# Patient Record
Sex: Female | Born: 1980 | Race: Black or African American | Hispanic: No | Marital: Single | Smoking: Never smoker
Health system: Southern US, Community
[De-identification: ages and names within clinical notes are randomized; demographics above are authoritative.]

## PROBLEM LIST (undated history)

## (undated) ENCOUNTER — Emergency Department (HOSPITAL_COMMUNITY): Admission: EM | Discharge status: Absent without leave | Payer: Medicaid Other

## (undated) DIAGNOSIS — D649 Anemia, unspecified: Secondary | ICD-10-CM

## (undated) DIAGNOSIS — I10 Essential (primary) hypertension: Secondary | ICD-10-CM

## (undated) DIAGNOSIS — R87629 Unspecified abnormal cytological findings in specimens from vagina: Secondary | ICD-10-CM

## (undated) DIAGNOSIS — B009 Herpesviral infection, unspecified: Secondary | ICD-10-CM

## (undated) DIAGNOSIS — D219 Benign neoplasm of connective and other soft tissue, unspecified: Secondary | ICD-10-CM

## (undated) HISTORY — DX: Unspecified abnormal cytological findings in specimens from vagina: R87.629

## (undated) HISTORY — DX: Anemia, unspecified: D64.9

## (undated) HISTORY — DX: Essential (primary) hypertension: I10

## (undated) HISTORY — PX: MYOMECTOMY: SHX85

---

## 2002-07-27 ENCOUNTER — Emergency Department (HOSPITAL_COMMUNITY): Admission: EM | Admit: 2002-07-27 | Discharge: 2002-07-27 | Payer: Self-pay

## 2002-07-28 ENCOUNTER — Emergency Department (HOSPITAL_COMMUNITY): Admission: EM | Admit: 2002-07-28 | Discharge: 2002-07-28 | Payer: Self-pay | Admitting: Emergency Medicine

## 2003-09-11 ENCOUNTER — Emergency Department (HOSPITAL_COMMUNITY): Admission: EM | Admit: 2003-09-11 | Discharge: 2003-09-12 | Payer: Self-pay | Admitting: Emergency Medicine

## 2005-07-20 ENCOUNTER — Emergency Department (HOSPITAL_COMMUNITY): Admission: EM | Admit: 2005-07-20 | Discharge: 2005-07-21 | Payer: Self-pay | Admitting: Emergency Medicine

## 2013-05-15 HISTORY — PX: UTERINE FIBROID SURGERY: SHX826

## 2013-07-14 ENCOUNTER — Encounter: Payer: Self-pay | Admitting: Obstetrics & Gynecology

## 2014-10-04 ENCOUNTER — Ambulatory Visit (INDEPENDENT_AMBULATORY_CARE_PROVIDER_SITE_OTHER): Payer: 59 | Admitting: Family Medicine

## 2014-10-04 VITALS — BP 118/90 | HR 138 | Temp 102.7°F | Resp 22 | Ht 66.0 in | Wt 259.0 lb

## 2014-10-04 DIAGNOSIS — R059 Cough, unspecified: Secondary | ICD-10-CM

## 2014-10-04 DIAGNOSIS — R000 Tachycardia, unspecified: Secondary | ICD-10-CM | POA: Diagnosis not present

## 2014-10-04 DIAGNOSIS — R05 Cough: Secondary | ICD-10-CM | POA: Diagnosis not present

## 2014-10-04 DIAGNOSIS — H6691 Otitis media, unspecified, right ear: Secondary | ICD-10-CM

## 2014-10-04 DIAGNOSIS — J069 Acute upper respiratory infection, unspecified: Secondary | ICD-10-CM | POA: Diagnosis not present

## 2014-10-04 DIAGNOSIS — J029 Acute pharyngitis, unspecified: Secondary | ICD-10-CM | POA: Diagnosis not present

## 2014-10-04 DIAGNOSIS — R509 Fever, unspecified: Secondary | ICD-10-CM | POA: Diagnosis not present

## 2014-10-04 LAB — POCT RAPID STREP A (OFFICE): RAPID STREP A SCREEN: NEGATIVE

## 2014-10-04 LAB — POCT CBC
Granulocyte percent: 77.7 %G (ref 37–80)
HEMATOCRIT: 39.4 % (ref 37.7–47.9)
Hemoglobin: 12.1 g/dL — AB (ref 12.2–16.2)
LYMPH, POC: 1.6 (ref 0.6–3.4)
MCH: 22.9 pg — AB (ref 27–31.2)
MCHC: 30.6 g/dL — AB (ref 31.8–35.4)
MCV: 74.8 fL — AB (ref 80–97)
MID (CBC): 0.5 (ref 0–0.9)
MPV: 7 fL (ref 0–99.8)
POC GRANULOCYTE: 7.3 — AB (ref 2–6.9)
POC LYMPH PERCENT: 17.2 %L (ref 10–50)
POC MID %: 5.1 %M (ref 0–12)
Platelet Count, POC: 340 10*3/uL (ref 142–424)
RBC: 5.27 M/uL (ref 4.04–5.48)
RDW, POC: 16.8 %
WBC: 9.4 10*3/uL (ref 4.6–10.2)

## 2014-10-04 LAB — GLUCOSE, POCT (MANUAL RESULT ENTRY): POC GLUCOSE: 105 mg/dL — AB (ref 70–99)

## 2014-10-04 LAB — POCT INFLUENZA A/B
INFLUENZA A, POC: NEGATIVE
Influenza B, POC: NEGATIVE

## 2014-10-04 MED ORDER — IPRATROPIUM BROMIDE 0.03 % NA SOLN: 2.0000 | Freq: Two times a day (BID) | NASAL | Status: DC

## 2014-10-04 MED ORDER — DM-GUAIFENESIN ER 30-600 MG PO TB12: 1.0000 | ORAL_TABLET | Freq: Two times a day (BID) | ORAL | Status: DC

## 2014-10-04 MED ORDER — AMOXICILLIN-POT CLAVULANATE 875-125 MG PO TABS: 1.0000 | ORAL_TABLET | Freq: Two times a day (BID) | ORAL | Status: DC

## 2014-10-04 NOTE — Progress Notes (Addendum)
Subjective:  This chart was scribed for Reginia Forts, MD by Leandra Kern, Medical Scribe. This patient was seen in Room 1 and the patient's care was started at 12:22 PM.   Patient ID: Catherine [name], female    DOB: 09/18/1980, 34 y.o.   MRN: [id number]  10/04/2014  Sore Throat; Nasal Congestion; Cough; and Fever   HPI  HPI Comments: Catherine [name] is a 34 y.o. female who presents to [facility] complaining of sore throat, gradual onset seven days ago.  Patient reports having associated symptoms of Chills, subjective fever, diaphoresis, bilateral ear pain, rhinorrhea (green discharge), mild coughs, nausea, and vomiting. Patient notes that she has 2 episodes of vomiting since 5 days ago. She also notes that she has trouble swallowing and talking due to sore throat. Patient reports that one night she had increased urinary frequency, once every two hours, whereas baseline is two times maximum during the night. She states that she has been taking an OTC medication for cold. She has not taken any medication for fever today however. Patient denies abdominal pain, diarrhea, skin rash, or dysuria.  Patient is not UTD on flu shot, and she reports that she has been in a sick environment (sore throat). Patient is a 3rd grade teacher, and she has not missed any school days since symptoms.    PCP in [place].  Review of Systems  Constitutional: Positive for fever, chills and diaphoresis.  HENT: Positive for congestion, ear pain, rhinorrhea, sinus pressure, sore throat, trouble swallowing and voice change. Negative for dental problem, drooling, ear discharge, nosebleeds and postnasal drip.   Respiratory: Positive for cough. Negative for shortness of breath, wheezing and stridor.   Gastrointestinal: Positive for nausea and vomiting. Negative for abdominal pain and diarrhea.  Genitourinary: Negative for dysuria and urgency.  Skin: Negative for rash.  Neurological: Positive for headaches.  Negative for dizziness and light-headedness.    Past Medical History  Diagnosis Date  . Anemia   . Hypertension    History reviewed. No pertinent past surgical history. No Known Allergies Current Outpatient Prescriptions  Medication Sig Dispense Refill  . valACYclovir (VALTREX) 500 MG tablet Take 500 mg by mouth 2 (two) times daily.    Marland Kitchen amoxicillin-clavulanate (AUGMENTIN) 875-125 MG per tablet Take 1 tablet by mouth 2 (two) times daily. 20 tablet 0  . dextromethorphan-guaiFENesin (MUCINEX DM) 30-600 MG per 12 hr tablet Take 1 tablet by mouth 2 (two) times daily. 28 tablet 0  . ipratropium (ATROVENT) 0.03 % nasal spray Place 2 sprays into the nose 2 (two) times daily. 30 mL 0  . lisinopril-hydrochlorothiazide (PRINZIDE,ZESTORETIC) 20-12.5 MG per tablet   0  . triamcinolone cream (KENALOG) 0.1 %   0   No current facility-administered medications for this visit.   History   Social History  . Marital Status: Single    Spouse Name: N/A  . Number of Children: N/A  . Years of Education: N/A   Occupational History  . Not on file.   Social History Main Topics  . Smoking status: Never Smoker   . Smokeless tobacco: Not on file  . Alcohol Use: Not on file  . Drug Use: Not on file  . Sexual Activity: Not on file   Other Topics Concern  . Not on file   Social History Narrative  . No narrative on file        Objective:    BP 118/90 mmHg  Pulse 138  Temp(Src) 102.7  F (39.3 C)  Resp 22  Ht 5\' 6"  (1.676 m)  Wt 259 lb (117.482 kg)  BMI 41.82 kg/m2  SpO2 97%  LMP 10/03/2014 Physical Exam  Constitutional: She is oriented to person, place, and time. She appears well-developed and well-nourished. No distress.  HENT:  [name]: Normocephalic and atraumatic.  Right Ear: External ear and ear canal normal. Tympanic membrane is erythematous and bulging.  Left Ear: Tympanic membrane, external ear and ear canal normal.  Nose: Nose normal.  Mouth/Throat: Oropharynx is clear and  moist. No oropharyngeal exudate.  right TM bulging and red, left TM is nl   Eyes: Pupils are equal, round, and reactive to light.  Neck: Normal range of motion. Neck supple.  Cardiovascular: Normal heart sounds.  Tachycardia present.  Exam reveals no gallop and no friction rub.   No murmur heard. 110 rate beginning of visit; 100 at end of visit.  Pulmonary/Chest: Effort normal and breath sounds normal. No respiratory distress. She has no wheezes. She has no rales.  Abdominal: Soft. Bowel sounds are normal. She exhibits no distension and no mass. There is no tenderness. There is no rebound and no guarding.  Musculoskeletal: She exhibits no edema.  Lymphadenopathy:    She has no cervical adenopathy.  Neurological: She is alert and oriented to person, place, and time. No cranial nerve deficit.  Skin: Skin is warm and dry. No rash noted. She is not diaphoretic.  Psychiatric: She has a normal mood and affect. Her behavior is normal.  Nursing note and vitals reviewed.  Results for orders placed or performed in visit on 10/04/14  POCT CBC  Result Value Ref Range   WBC 9.4 4.6 - 10.2 K/uL   Lymph, poc 1.6 0.6 - 3.4   POC LYMPH PERCENT 17.2 10 - 50 %L   MID (cbc) 0.5 0 - 0.9   POC MID % 5.1 0 - 12 %M   POC Granulocyte 7.3 (A) 2 - 6.9   Granulocyte percent 77.7 37 - 80 %G   RBC 5.27 4.04 - 5.48 M/uL   Hemoglobin 12.1 (A) 12.2 - 16.2 g/dL   HCT, POC 39.4 37.7 - 47.9 %   MCV 74.8 (A) 80 - 97 fL   MCH, POC 22.9 (A) 27 - 31.2 pg   MCHC 30.6 (A) 31.8 - 35.4 g/dL   RDW, POC 16.8 %   Platelet Count, POC 340 142 - 424 K/uL   MPV 7.0 0 - 99.8 fL  POCT glucose (manual entry)  Result Value Ref Range   POC Glucose 105 (A) 70 - 99 mg/dl  POCT Influenza A/B  Result Value Ref Range   Influenza A, POC Negative    Influenza B, POC Negative   POCT rapid strep A  Result Value Ref Range   Rapid Strep A Screen Negative Negative   IBUPROFEN 400MG  PO  IN OFFICE DURING TRIAGE PROCESS.    Assessment &  Plan:   1. Fever and chills   2. Sore throat   3. Cough   4. Recurrent acute otitis media of right ear, unspecified otitis media type   5. Tachycardia   6. Acute upper respiratory infection     1. R otitis media: New. Rx for Augmentin, Mucinex, Atrovent nasal spray provided. 2. URI: New. Send throat culture; supportive care with rest, fluids, Ibuprofen.  Rx for Mucinex DM and Atrovent nasal spray provided. 3. Tachycardia: New; improvement with treatment of fever.      Meds ordered this encounter  Medications  . valACYclovir (VALTREX) 500 MG tablet    Sig: Take 500 mg by mouth 2 (two) times daily.  Marland Kitchen lisinopril-hydrochlorothiazide (PRINZIDE,ZESTORETIC) 20-12.5 MG per tablet    Sig:     Refill:  0  . triamcinolone cream (KENALOG) 0.1 %    Sig:     Refill:  0  . amoxicillin-clavulanate (AUGMENTIN) 875-125 MG per tablet    Sig: Take 1 tablet by mouth 2 (two) times daily.    Dispense:  20 tablet    Refill:  0  . dextromethorphan-guaiFENesin (MUCINEX DM) 30-600 MG per 12 hr tablet    Sig: Take 1 tablet by mouth 2 (two) times daily.    Dispense:  28 tablet    Refill:  0  . ipratropium (ATROVENT) 0.03 % nasal spray    Sig: Place 2 sprays into the nose 2 (two) times daily.    Dispense:  30 mL    Refill:  0    No Follow-up on file.  I personally performed the services described in this documentation, which was scribed in my presence. The recorded information has been reviewed and considered.  Dejaun Vidrio Elayne Guerin, M.D. [facility] [address] [place], [place]  [address] [phone] phone [phone] fax

## 2014-10-04 NOTE — Patient Instructions (Signed)

## 2014-10-06 LAB — CULTURE, GROUP A STREP: ORGANISM ID, BACTERIA: NORMAL

## 2015-02-11 ENCOUNTER — Ambulatory Visit (INDEPENDENT_AMBULATORY_CARE_PROVIDER_SITE_OTHER): Payer: Self-pay | Admitting: Family Medicine

## 2015-02-11 VITALS — BP 122/80 | HR 93 | Temp 98.6°F | Resp 16 | Ht 66.0 in | Wt 268.0 lb

## 2015-02-11 DIAGNOSIS — N926 Irregular menstruation, unspecified: Secondary | ICD-10-CM

## 2015-02-11 DIAGNOSIS — Z3401 Encounter for supervision of normal first pregnancy, first trimester: Secondary | ICD-10-CM

## 2015-02-11 LAB — POCT URINE PREGNANCY: Preg Test, Ur: POSITIVE — AB

## 2015-02-11 MED ORDER — PRENATAL 19 PO TABS: 1.0000 | ORAL_TABLET | Freq: Every day | ORAL | Status: AC

## 2015-02-11 NOTE — Progress Notes (Signed)
This is a 34 year old woman who works at [employer]. She his not had a menstrual period since June 21 of this year. She's had some breast tenderness and some postprandial nausea. She is not married, and does not expect her her partner to show up for any potential pregnancy.  Patient has never been pregnant before. She's had no bleeding.  Objective: Patient in no acute distress BP 122/80 mmHg  Pulse 93  Temp(Src) 98.6 F (37 C) (Oral)  Resp 16  Ht 5\' 6"  (1.676 m)  Wt 268 lb (121.564 kg)  BMI 43.28 kg/m2  SpO2 99%  LMP 11/03/2014  Results for orders placed or performed in visit on 02/11/15  POCT urine pregnancy  Result Value Ref Range   Preg Test, Ur Positive (A) Negative   Assessment: Uncomplicated pregnancy, 13 weeks  Plan:  Refer to Ob-Gyn and start prenatals  Robyn Haber, MD

## 2015-02-11 NOTE — Patient Instructions (Signed)
First Trimester of Pregnancy The first trimester of pregnancy is from week 1 until the end of week 12 (months 1 through 3). A week after a sperm fertilizes an egg, the egg will implant on the wall of the uterus. This embryo will begin to develop into a baby. Genes from you and your partner are forming the baby. The female genes determine whether the baby is a boy or a girl. At 6-8 weeks, the eyes and face are formed, and the heartbeat can be seen on ultrasound. At the end of 12 weeks, all the baby's organs are formed.  Now that you are pregnant, you will want to do everything you can to have a healthy baby. Two of the most important things are to get good prenatal care and to follow your health care provider's instructions. Prenatal care is all the medical care you receive before the baby's birth. This care will help prevent, find, and treat any problems during the pregnancy and childbirth. BODY CHANGES Your body goes through many changes during pregnancy. The changes vary from woman to woman.   You may gain or lose a couple of pounds at first.  You may feel sick to your stomach (nauseous) and throw up (vomit). If the vomiting is uncontrollable, call your health care provider.  You may tire easily.  You may develop headaches that can be relieved by medicines approved by your health care provider.  You may urinate more often. Painful urination may mean you have a bladder infection.  You may develop heartburn as a result of your pregnancy.  You may develop constipation because certain hormones are causing the muscles that push waste through your intestines to slow down.  You may develop hemorrhoids or swollen, bulging veins (varicose veins).  Your breasts may begin to grow larger and become tender. Your nipples may stick out more, and the tissue that surrounds them (areola) may become darker.  Your gums may bleed and may be sensitive to brushing and flossing.  Dark spots or blotches (chloasma,  mask of pregnancy) may develop on your face. This will likely fade after the baby is born.  Your menstrual periods will stop.  You may have a loss of appetite.  You may develop cravings for certain kinds of food.  You may have changes in your emotions from day to day, such as being excited to be pregnant or being concerned that something may go wrong with the pregnancy and baby.  You may have more vivid and strange dreams.  You may have changes in your hair. These can include thickening of your hair, rapid growth, and changes in texture. Some women also have hair loss during or after pregnancy, or hair that feels dry or thin. Your hair will most likely return to normal after your baby is born. WHAT TO EXPECT AT YOUR PRENATAL VISITS During a routine prenatal visit:  You will be weighed to make sure you and the baby are growing normally.  Your blood pressure will be taken.  Your abdomen will be measured to track your baby's growth.  The fetal heartbeat will be listened to starting around week 10 or 12 of your pregnancy.  Test results from any previous visits will be discussed. Your health care provider may ask you:  How you are feeling.  If you are feeling the baby move.  If you have had any abnormal symptoms, such as leaking fluid, bleeding, severe headaches, or abdominal cramping.  If you have any questions. Other tests   that may be performed during your first trimester include:  Blood tests to find your blood type and to check for the presence of any previous infections. They will also be used to check for low iron levels (anemia) and Rh antibodies. Later in the pregnancy, blood tests for diabetes will be done along with other tests if problems develop.  Urine tests to check for infections, diabetes, or protein in the urine.  An ultrasound to confirm the proper growth and development of the baby.  An amniocentesis to check for possible genetic problems.  Fetal screens for  spina bifida and Down syndrome.  You may need other tests to make sure you and the baby are doing well. HOME CARE INSTRUCTIONS  Medicines  Follow your health care provider's instructions regarding medicine use. Specific medicines may be either safe or unsafe to take during pregnancy.  Take your prenatal vitamins as directed.  If you develop constipation, try taking a stool softener if your health care provider approves. Diet  Eat regular, well-balanced meals. Choose a variety of foods, such as meat or vegetable-based protein, fish, milk and low-fat dairy products, vegetables, fruits, and whole grain breads and cereals. Your health care provider will help you determine the amount of weight gain that is right for you.  Avoid raw meat and uncooked cheese. These carry germs that can cause birth defects in the baby.  Eating four or five small meals rather than three large meals a day may help relieve nausea and vomiting. If you start to feel nauseous, eating a few soda crackers can be helpful. Drinking liquids between meals instead of during meals also seems to help nausea and vomiting.  If you develop constipation, eat more high-fiber foods, such as fresh vegetables or fruit and whole grains. Drink enough fluids to keep your urine clear or pale yellow. Activity and Exercise  Exercise only as directed by your health care provider. Exercising will help you:  Control your weight.  Stay in shape.  Be prepared for labor and delivery.  Experiencing pain or cramping in the lower abdomen or low back is a good sign that you should stop exercising. Check with your health care provider before continuing normal exercises.  Try to avoid standing for long periods of time. Move your legs often if you must stand in one place for a long time.  Avoid heavy lifting.  Wear low-heeled shoes, and practice good posture.  You may continue to have sex unless your health care provider directs you  otherwise. Relief of Pain or Discomfort  Wear a good support bra for breast tenderness.   Take warm sitz baths to soothe any pain or discomfort caused by hemorrhoids. Use hemorrhoid cream if your health care provider approves.   Rest with your legs elevated if you have leg cramps or low back pain.  If you develop varicose veins in your legs, wear support hose. Elevate your feet for 15 minutes, 3-4 times a day. Limit salt in your diet. Prenatal Care  Schedule your prenatal visits by the twelfth week of pregnancy. They are usually scheduled monthly at first, then more often in the last 2 months before delivery.  Write down your questions. Take them to your prenatal visits.  Keep all your prenatal visits as directed by your health care provider. Safety  Wear your seat belt at all times when driving.  Make a list of emergency phone numbers, including numbers for family, friends, the hospital, and police and fire departments. General Tips    Ask your health care provider for a referral to a local prenatal education class. Begin classes no later than at the beginning of month 6 of your pregnancy.  Ask for help if you have counseling or nutritional needs during pregnancy. Your health care provider can offer advice or refer you to specialists for help with various needs.  Do not use hot tubs, steam rooms, or saunas.  Do not douche or use tampons or scented sanitary pads.  Do not cross your legs for long periods of time.  Avoid cat litter boxes and soil used by cats. These carry germs that can cause birth defects in the baby and possibly loss of the fetus by miscarriage or stillbirth.  Avoid all smoking, herbs, alcohol, and medicines not prescribed by your health care provider. Chemicals in these affect the formation and growth of the baby.  Schedule a dentist appointment. At home, brush your teeth with a soft toothbrush and be gentle when you floss. SEEK MEDICAL CARE IF:   You have  dizziness.  You have mild pelvic cramps, pelvic pressure, or nagging pain in the abdominal area.  You have persistent nausea, vomiting, or diarrhea.  You have a bad smelling vaginal discharge.  You have pain with urination.  You notice increased swelling in your face, hands, legs, or ankles. SEEK IMMEDIATE MEDICAL CARE IF:   You have a fever.  You are leaking fluid from your vagina.  You have spotting or bleeding from your vagina.  You have severe abdominal cramping or pain.  You have rapid weight gain or loss.  You vomit blood or material that looks like coffee grounds.  You are exposed to German measles and have never had them.  You are exposed to fifth disease or chickenpox.  You develop a severe headache.  You have shortness of breath.  You have any kind of trauma, such as from a fall or a car accident. Document Released: 04/25/2001 Document Revised: 09/15/2013 Document Reviewed: 03/11/2013 Exit[employer] Patient Information 2015 [employer]. This information is not intended to replace advice given to you by your health care provider. Make sure you discuss any questions you have with your health care provider.  

## 2015-03-04 LAB — OB RESULTS CONSOLE ANTIBODY SCREEN: ANTIBODY SCREEN: NEGATIVE

## 2015-03-04 LAB — OB RESULTS CONSOLE HGB/HCT, BLOOD
HCT: 35 %
HEMOGLOBIN: 11.3 g/dL

## 2015-03-04 LAB — OB RESULTS CONSOLE VARICELLA ZOSTER ANTIBODY, IGG: VARICELLA IGG: IMMUNE

## 2015-03-04 LAB — OB RESULTS CONSOLE HIV ANTIBODY (ROUTINE TESTING): HIV: NONREACTIVE

## 2015-03-04 LAB — OB RESULTS CONSOLE PLATELET COUNT: Platelets: 372 10*3/uL

## 2015-03-04 LAB — OB RESULTS CONSOLE HEPATITIS B SURFACE ANTIGEN: Hepatitis B Surface Ag: NEGATIVE

## 2015-03-04 LAB — OB RESULTS CONSOLE GC/CHLAMYDIA
CHLAMYDIA, DNA PROBE: NEGATIVE
Gonorrhea: NEGATIVE

## 2015-03-04 LAB — OB RESULTS CONSOLE RUBELLA ANTIBODY, IGM: RUBELLA: IMMUNE

## 2015-03-04 LAB — OB RESULTS CONSOLE RPR: RPR: NONREACTIVE

## 2015-03-04 LAB — OB RESULTS CONSOLE ABO/RH: RH TYPE: POSITIVE

## 2015-03-08 ENCOUNTER — Telehealth: Payer: Self-pay

## 2015-03-08 ENCOUNTER — Other Ambulatory Visit (HOSPITAL_COMMUNITY): Payer: Self-pay | Admitting: Nurse Practitioner

## 2015-03-08 DIAGNOSIS — O09522 Supervision of elderly multigravida, second trimester: Secondary | ICD-10-CM

## 2015-03-08 DIAGNOSIS — Z3689 Encounter for other specified antenatal screening: Secondary | ICD-10-CM

## 2015-03-08 NOTE — Telephone Encounter (Signed)
[facility] called in regards to the referral that was sent twice to their office for Catherine [name]. They are denying the referral. We need to select a new office to refer this pt to.

## 2015-03-17 ENCOUNTER — Encounter (HOSPITAL_COMMUNITY): Payer: Self-pay

## 2015-03-17 ENCOUNTER — Ambulatory Visit (HOSPITAL_COMMUNITY): Payer: Self-pay

## 2015-03-22 ENCOUNTER — Encounter: Payer: Self-pay | Admitting: *Deleted

## 2015-03-22 ENCOUNTER — Encounter: Payer: Self-pay | Admitting: Family Medicine

## 2015-03-22 DIAGNOSIS — O09529 Supervision of elderly multigravida, unspecified trimester: Secondary | ICD-10-CM | POA: Insufficient documentation

## 2015-03-22 DIAGNOSIS — O98519 Other viral diseases complicating pregnancy, unspecified trimester: Secondary | ICD-10-CM

## 2015-03-22 DIAGNOSIS — O0990 Supervision of high risk pregnancy, unspecified, unspecified trimester: Secondary | ICD-10-CM | POA: Insufficient documentation

## 2015-03-22 DIAGNOSIS — B009 Herpesviral infection, unspecified: Secondary | ICD-10-CM | POA: Insufficient documentation

## 2015-03-22 DIAGNOSIS — A6000 Herpesviral infection of urogenital system, unspecified: Secondary | ICD-10-CM

## 2015-03-22 DIAGNOSIS — E669 Obesity, unspecified: Secondary | ICD-10-CM | POA: Insufficient documentation

## 2015-03-22 DIAGNOSIS — O10919 Unspecified pre-existing hypertension complicating pregnancy, unspecified trimester: Secondary | ICD-10-CM

## 2015-03-25 ENCOUNTER — Ambulatory Visit (INDEPENDENT_AMBULATORY_CARE_PROVIDER_SITE_OTHER): Payer: Medicaid Other | Admitting: Family

## 2015-03-25 VITALS — BP 153/95 | HR 110 | Temp 98.3°F | Wt 277.9 lb

## 2015-03-25 DIAGNOSIS — O09522 Supervision of elderly multigravida, second trimester: Secondary | ICD-10-CM | POA: Diagnosis not present

## 2015-03-25 DIAGNOSIS — O0992 Supervision of high risk pregnancy, unspecified, second trimester: Secondary | ICD-10-CM

## 2015-03-25 DIAGNOSIS — O169 Unspecified maternal hypertension, unspecified trimester: Secondary | ICD-10-CM | POA: Insufficient documentation

## 2015-03-25 DIAGNOSIS — O3429 Maternal care due to uterine scar from other previous surgery: Secondary | ICD-10-CM | POA: Insufficient documentation

## 2015-03-25 DIAGNOSIS — O0990 Supervision of high risk pregnancy, unspecified, unspecified trimester: Secondary | ICD-10-CM

## 2015-03-25 DIAGNOSIS — O162 Unspecified maternal hypertension, second trimester: Secondary | ICD-10-CM | POA: Diagnosis not present

## 2015-03-25 LAB — CBC
HCT: 32.7 % — ABNORMAL LOW (ref 36.0–46.0)
Hemoglobin: 11.1 g/dL — ABNORMAL LOW (ref 12.0–15.0)
MCH: 25.3 pg — AB (ref 26.0–34.0)
MCHC: 33.9 g/dL (ref 30.0–36.0)
MCV: 74.5 fL — ABNORMAL LOW (ref 78.0–100.0)
MPV: 8.8 fL (ref 8.6–12.4)
PLATELETS: 363 10*3/uL (ref 150–400)
RBC: 4.39 MIL/uL (ref 3.87–5.11)
RDW: 17.7 % — AB (ref 11.5–15.5)
WBC: 9.4 10*3/uL (ref 4.0–10.5)

## 2015-03-25 LAB — POCT URINALYSIS DIP (DEVICE)
Bilirubin Urine: NEGATIVE
GLUCOSE, UA: NEGATIVE mg/dL
KETONES UR: NEGATIVE mg/dL
Leukocytes, UA: NEGATIVE
NITRITE: NEGATIVE
PH: 6 (ref 5.0–8.0)
PROTEIN: NEGATIVE mg/dL
Specific Gravity, Urine: 1.025 (ref 1.005–1.030)
Urobilinogen, UA: 0.2 mg/dL (ref 0.0–1.0)

## 2015-03-25 LAB — COMPREHENSIVE METABOLIC PANEL
ALK PHOS: 69 U/L (ref 33–115)
ALT: 27 U/L (ref 6–29)
AST: 23 U/L (ref 10–30)
Albumin: 3.5 g/dL — ABNORMAL LOW (ref 3.6–5.1)
BILIRUBIN TOTAL: 0.2 mg/dL (ref 0.2–1.2)
BUN: 5 mg/dL — AB (ref 7–25)
CO2: 23 mmol/L (ref 20–31)
CREATININE: 0.45 mg/dL — AB (ref 0.50–1.10)
Calcium: 9.1 mg/dL (ref 8.6–10.2)
Chloride: 104 mmol/L (ref 98–110)
GLUCOSE: 74 mg/dL (ref 65–99)
POTASSIUM: 4.1 mmol/L (ref 3.5–5.3)
SODIUM: 136 mmol/L (ref 135–146)
TOTAL PROTEIN: 6.9 g/dL (ref 6.1–8.1)

## 2015-03-25 MED ORDER — LABETALOL HCL 200 MG PO TABS: 200.0000 mg | ORAL_TABLET | Freq: Two times a day (BID) | ORAL | Status: DC

## 2015-03-25 MED ORDER — ASPIRIN 81 MG PO CHEW: 81.0000 mg | CHEWABLE_TABLET | Freq: Every day | ORAL | Status: AC

## 2015-03-25 NOTE — Progress Notes (Signed)
Initial prenatal education packet given Breastfeeding tip of the week reviewed

## 2015-03-25 NOTE — Patient Instructions (Signed)

## 2015-03-25 NOTE — Progress Notes (Signed)
   Subjective:    Catherine [name] is a [id number] [redacted]w[redacted]d being seen today for her first obstetrical visit.  Her obstetrical history is significant for advanced maternal age and chronic hypertension. Pt also has a history of a myomectomy, per records pt needs csection at 36-37 weeks.  Patient does intend to breast feed. Pregnancy history fully reviewed.  Patient reports no complaints.  Filed Vitals:   03/25/15 0818  BP: 153/95  Pulse: 110  Temp: 98.3 F (36.8 C)  Weight: 277 lb 14.4 oz (126.055 kg)    HISTORY: OB History  Gravida Para Term Preterm AB SAB TAB Ectopic Multiple Living  4 0 0 0 3 0 3 0 0 0     # Outcome Date GA Lbr Len/2nd Weight Sex Delivery Anes PTL Lv  4 Current           3 TAB 2008     TAB     2 TAB 2007     TAB     1 TAB 2001     TAB        Past Medical History  Diagnosis Date  . Anemia   . Hypertension   . Vaginal Pap smear, abnormal    Past Surgical History  Procedure Laterality Date  . Uterine fibroid surgery  2015    Needs C/S   Family History  Problem Relation Age of Onset  . Hyperlipidemia Mother   . Hypertension Mother   . Deep vein thrombosis Father   . Pulmonary disease Father   . Diabetes Brother   . Neurologic Disorder Brother     cyst on brain  . Cancer Maternal Grandmother     cervical cancer     Exam   Filed Vitals:   03/25/15 0818  BP: 153/95  Pulse: 110  Temp: 98.3 F (36.8 C)  Weight: 277 lb 14.4 oz (126.055 kg)    Fetal Status: Fetal Heart Rate (bpm): 150 Fundal Height: 21 cm Movement: Absent     General:  Alert, oriented and cooperative. Patient is in no acute distress.  Skin: Skin is warm and dry. No rash noted.   Cardiovascular: Normal heart rate noted  Respiratory: Normal respiratory effort, no problems with respiration noted  Abdomen: Soft, gravid, appropriate for gestational age. Pain/Pressure: Present     Pelvic: Vag. Bleeding: None     Cervical exam deferred        Extremities: Normal range of motion.  Edema:  Moderate pitting, indentation subsides rapidly  Mental Status: Normal mood and affect. Normal behavior. Normal judgment and thought content.   Urinalysis:    Protein trace Glucose negative     Assessment:    Pregnancy: [id number] Patient Active Problem List   Diagnosis Date Noted  . Hypertension affecting pregnancy, antepartum 03/25/2015  . HTN in pregnancy, chronic 03/22/2015  . Supervision of high risk pregnancy, antepartum 03/22/2015  . 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 Obesity 03/22/2015  . AMA (advanced maternal age) multigravida 35+ 03/22/2015        Plan:     Initial labs drawn. Prenatal vitamins. Baseline labs drawn:  CMP, CBC, PR:CR (pt states difficult to do 24 hr due to work schedule). Begin Baby ASA RX Labetalol 200 mg PO BID  Problem list reviewed and updated. Genetic Screening discussed:  Appt with MFM for genetic counseling scheduled for 03/26/15 Ultrasound discussed; fetal survey: scheduled for 03/26/15.  Follow up in 2 weeks.  Kathrine Haddock N 03/25/2015

## 2015-03-26 ENCOUNTER — Ambulatory Visit (HOSPITAL_COMMUNITY)
Admission: RE | Admit: 2015-03-26 | Discharge: 2015-03-26 | Disposition: A | Discharge status: Home / Self Care | Payer: Medicaid Other | Source: Ambulatory Visit | Attending: Nurse Practitioner | Admitting: Nurse Practitioner

## 2015-03-26 ENCOUNTER — Other Ambulatory Visit (HOSPITAL_COMMUNITY): Payer: Self-pay | Admitting: Nurse Practitioner

## 2015-03-26 VITALS — BP 134/66 | HR 100 | Wt 278.4 lb

## 2015-03-26 DIAGNOSIS — O3429 Maternal care due to uterine scar from other previous surgery: Secondary | ICD-10-CM | POA: Diagnosis not present

## 2015-03-26 DIAGNOSIS — O98512 Other viral diseases complicating pregnancy, second trimester: Secondary | ICD-10-CM

## 2015-03-26 DIAGNOSIS — O99212 Obesity complicating pregnancy, second trimester: Secondary | ICD-10-CM | POA: Insufficient documentation

## 2015-03-26 DIAGNOSIS — O10019 Pre-existing essential hypertension complicating pregnancy, unspecified trimester: Secondary | ICD-10-CM | POA: Diagnosis not present

## 2015-03-26 DIAGNOSIS — O99211 Obesity complicating pregnancy, first trimester: Secondary | ICD-10-CM

## 2015-03-26 DIAGNOSIS — O09522 Supervision of elderly multigravida, second trimester: Secondary | ICD-10-CM | POA: Insufficient documentation

## 2015-03-26 DIAGNOSIS — Z315 Encounter for genetic counseling: Secondary | ICD-10-CM | POA: Insufficient documentation

## 2015-03-26 DIAGNOSIS — O34592 Maternal care for other abnormalities of gravid uterus, second trimester: Secondary | ICD-10-CM

## 2015-03-26 DIAGNOSIS — Z3689 Encounter for other specified antenatal screening: Secondary | ICD-10-CM

## 2015-03-26 DIAGNOSIS — O10919 Unspecified pre-existing hypertension complicating pregnancy, unspecified trimester: Secondary | ICD-10-CM

## 2015-03-26 DIAGNOSIS — Z36 Encounter for antenatal screening of mother: Secondary | ICD-10-CM | POA: Insufficient documentation

## 2015-03-26 DIAGNOSIS — B009 Herpesviral infection, unspecified: Secondary | ICD-10-CM

## 2015-03-26 DIAGNOSIS — Z3A20 20 weeks gestation of pregnancy: Secondary | ICD-10-CM

## 2015-03-26 DIAGNOSIS — O162 Unspecified maternal hypertension, second trimester: Secondary | ICD-10-CM

## 2015-03-26 LAB — PROTEIN / CREATININE RATIO, URINE
Creatinine, Urine: 179 mg/dL (ref 20–320)
PROTEIN CREATININE RATIO: 145 mg/g{creat} (ref 21–161)
TOTAL PROTEIN, URINE: 26 mg/dL — AB (ref 5–24)

## 2015-03-26 NOTE — Progress Notes (Signed)
Genetic Counseling  High-Risk Gestation Note  Appointment Date:  03/26/2015 Referred By: Catherine Click, NP Date of Birth:  10/01/1980   Pregnancy History: G4P0030 Estimated Date of Delivery: 08/10/15 Estimated Gestational Age: [redacted]w[redacted]d Attending: Renella Cunas, MD  [name] was seen for genetic counseling because of a maternal age of 34 y.o.. [employer] Genetic Counseling Intern, Catherine [name], assisted with genetic counseling under my direct supervision.   In Summary:  Reviewed risks for fetal aneuploidy related to maternal age of 34 years old.   Patient previously had Quad screen within normal limits; reduced risks for Down syndrome and Trisomy 18  Detailed ultrasound performed today      Patient elected to pursue NIPS today (Panorama)  Declined amniocentesis  She was counseled regarding maternal age and the association with risk for chromosome conditions due to nondisjunction with aging of the ova.   We reviewed chromosomes, nondisjunction, and the associated 1 in 111 risk for fetal aneuploidy related to a maternal age of 34 y.o. at [redacted]w[redacted]d gestation.  She was counseled that the risk for aneuploidy decreases as gestational age increases, accounting for those pregnancies which spontaneously abort.  We specifically discussed Down syndrome (trisomy 92), trisomies 45 and 34, and sex chromosome aneuploidies (47,XXX and 47,XXY) including the common features and prognoses of each.   [name] previously had Quad screen, which was within normal limits for conditions screened. We reviewed that Quad screen performed through [facility] reduced the risks for fetal Down syndrome (1 in 279 to 1 in 4,652), trisomy 18 (1 in 840 to less than 1 in 10,000), and ONTDs. She was counseled that screening tests are used to modify a patient's a priori risk for aneuploidy, typically based on age. This estimate provides a pregnancy specific risk assessment.   We reviewed additional  available screening options including noninvasive prenatal screening (NIPS)/cell free DNA (cfDNA) testing and detailed ultrasound.  We reviewed the benefits and limitations of each option. Specifically, we discussed the conditions for which each test screens, the detection rates, and false positive rates of each. She was also counseled regarding diagnostic testing via amniocentesis. We reviewed the approximate 1 in 99991111 risk for complications for amniocentesis, including spontaneous pregnancy loss. After consideration of all the options, she elected to proceed with NIPS (Panorama through [facility] laboratory).  Those results will be available in 8-10 days.    A detailed ultrasound was performed today. The ultrasound report will be sent under separate cover. There were no visualized fetal anomalies or markers suggestive of aneuploidy. She understands that screening tests cannot rule out all birth defects or genetic syndromes. The patient was advised of this limitation and states she still does not want additional testing at this time.   Catherine [name] was provided with written information regarding sickle cell anemia (SCA) including the carrier frequency and incidence in the African-American population, the availability of carrier testing and prenatal diagnosis if indicated.  In addition, we discussed that hemoglobinopathies are routinely screened for as part of the [place] newborn screening panel.  She reported that hemoglobin electrophoresis was previously performed through her OB.   Both family histories were reviewed and found to be noncontributory for birth defects, intellectual disability, and known genetic conditions. Without further information regarding the provided family history, an accurate genetic risk cannot be calculated. Further genetic counseling is warranted if more information is obtained.  [name] denied exposure to environmental toxins or chemical agents. She denied the use  of alcohol,  tobacco or street drugs. She denied significant viral illnesses during the course of her pregnancy. Her medical and surgical histories were contributory for previous uterine fibroid surgery. She also reported a history of herpes, treated with valtrex, and hypertension, treated with labetalol.    I counseled [name] regarding the above risks and available options.  The approximate face-to-face time with the genetic counselor was 40 minutes.  Catherine Oman, MS,  Certified Genetic Counselor 03/26/2015

## 2015-03-29 ENCOUNTER — Encounter (HOSPITAL_COMMUNITY): Payer: Self-pay

## 2015-04-02 ENCOUNTER — Telehealth (HOSPITAL_COMMUNITY): Payer: Self-pay | Admitting: MS"

## 2015-04-02 NOTE — Telephone Encounter (Signed)
Called Catherine [name] to discuss her prenatal cell free DNA test results.  [name] had Panorama testing through [facility].  Testing was offered because of advanced maternal age.   The patient was identified by name and DOB.  We reviewed that these are within normal limits, showing a less than 1 in 10,000 risk for trisomies 21, 18 and 13, and monosomy X (Turner syndrome).  In addition, the risk for triploidy/vanishing twin and sex chromosome trisomies (47,XXX and 47,XXY) was also low risk.  We reviewed that this testing identifies > 99% of pregnancies with trisomy 39, trisomy 5, sex chromosome trisomies (47,XXX and 47,XXY), and triploidy. The detection rate for trisomy 18 is 96%.  The detection rate for monosomy X is ~92%.  The false positive rate is <0.1% for all conditions. Testing was also consistent with female fetal sex.  She understands that this testing does not identify all genetic conditions.  All questions were answered to her satisfaction, she was encouraged to call with additional questions or concerns.  Chipper Oman, MS Certified Genetic Counselor 04/02/2015 3:57 PM

## 2015-04-07 ENCOUNTER — Other Ambulatory Visit (HOSPITAL_COMMUNITY): Payer: Self-pay

## 2015-04-07 ENCOUNTER — Ambulatory Visit (INDEPENDENT_AMBULATORY_CARE_PROVIDER_SITE_OTHER): Payer: Medicaid Other | Admitting: Family Medicine

## 2015-04-07 VITALS — BP 142/79 | HR 96 | Temp 98.4°F | Wt 278.0 lb

## 2015-04-07 DIAGNOSIS — O09522 Supervision of elderly multigravida, second trimester: Secondary | ICD-10-CM | POA: Diagnosis not present

## 2015-04-07 DIAGNOSIS — O162 Unspecified maternal hypertension, second trimester: Secondary | ICD-10-CM

## 2015-04-07 DIAGNOSIS — O0992 Supervision of high risk pregnancy, unspecified, second trimester: Secondary | ICD-10-CM

## 2015-04-07 DIAGNOSIS — O3429 Maternal care due to uterine scar from other previous surgery: Secondary | ICD-10-CM | POA: Diagnosis not present

## 2015-04-07 LAB — POCT URINALYSIS DIP (DEVICE)
Bilirubin Urine: NEGATIVE
Glucose, UA: NEGATIVE mg/dL
Ketones, ur: NEGATIVE mg/dL
Leukocytes, UA: NEGATIVE
Nitrite: NEGATIVE
Protein, ur: NEGATIVE mg/dL
Specific Gravity, Urine: 1.025 (ref 1.005–1.030)
Urobilinogen, UA: 1 mg/dL (ref 0.0–1.0)
pH: 6.5 (ref 5.0–8.0)

## 2015-04-07 MED ORDER — LABETALOL HCL 300 MG PO TABS: 300.0000 mg | ORAL_TABLET | Freq: Two times a day (BID) | ORAL | Status: AC

## 2015-04-07 NOTE — Patient Instructions (Addendum)
Compression stocking- medical supply   Safe Medications in Pregnancy   Acne: Benzoyl Peroxide Salicylic Acid  Backache/Headache: Tylenol: 2 regular strength every 4 hours OR              2 Extra strength every 6 hours  Colds/Coughs/Allergies: Benadryl (alcohol free) 25 mg every 6 hours as needed Breath right strips Claritin Cepacol throat lozenges Chloraseptic throat spray Cold-Eeze- up to three times per day Cough drops, alcohol free Flonase (by prescription only) Guaifenesin Mucinex Robitussin DM (plain only, alcohol free) Saline nasal spray/drops Sudafed (pseudoephedrine) & Actifed ** use only after [redacted] weeks gestation and if you do not have high blood pressure Tylenol Vicks Vaporub Zinc lozenges Zyrtec   Constipation: Colace Ducolax suppositories Fleet enema Glycerin suppositories Metamucil Milk of magnesia Miralax Senokot Smooth move tea  Diarrhea: Kaopectate Imodium A-D  *NO pepto Bismol  Hemorrhoids: Anusol Anusol HC Preparation H Tucks  Indigestion: Tums Maalox Mylanta Zantac  Pepcid  Insomnia: Benadryl (alcohol free) 25mg  every 6 hours as needed Tylenol PM Unisom, no Gelcaps  Leg Cramps: Tums MagGel  Nausea/Vomiting:  Bonine Dramamine Emetrol Ginger extract Sea bands Meclizine  Nausea medication to take during pregnancy:  Unisom (doxylamine succinate 25 mg tablets) Take one tablet daily at bedtime. If symptoms are not adequately controlled, the dose can be increased to a maximum recommended dose of two tablets daily (1/2 tablet in the morning, 1/2 tablet mid-afternoon and one at bedtime). Vitamin B6 100mg  tablets. Take one tablet twice a day (up to 200 mg per day).  Skin Rashes: Aveeno products Benadryl cream or 25mg  every 6 hours as needed Calamine Lotion 1% cortisone cream  Yeast infection: Gyne-lotrimin 7 Monistat 7   **If taking multiple medications, please check labels to avoid duplicating the same active  ingredients **take medication as directed on the label ** Do not exceed 4000 mg of tylenol in 24 hours **Do not take medications that contain aspirin or ibuprofen     Second Trimester of Pregnancy The second trimester is from week 13 through week 28, months 4 through 6. The second trimester is often a time when you feel your best. Your body has also adjusted to being pregnant, and you begin to feel better physically. Usually, morning sickness has lessened or quit completely, you may have more energy, and you may have an increase in appetite. The second trimester is also a time when the fetus is growing rapidly. At the end of the sixth month, the fetus is about 9 inches long and weighs about 1 pounds. You will likely begin to feel the baby move (quickening) between 18 and 20 weeks of the pregnancy. BODY CHANGES Your body goes through many changes during pregnancy. The changes vary from woman to woman.   Your weight will continue to increase. You will notice your lower abdomen bulging out.  You may begin to get stretch marks on your hips, abdomen, and breasts.  You may develop headaches that can be relieved by medicines approved by your health care provider.  You may urinate more often because the fetus is pressing on your bladder.  You may develop or continue to have heartburn as a result of your pregnancy.  You may develop constipation because certain hormones are causing the muscles that push waste through your intestines to slow down.  You may develop hemorrhoids or swollen, bulging veins (varicose veins).  You may have back pain because of the weight gain and pregnancy hormones relaxing your joints between the bones in  your pelvis and as a result of a shift in weight and the muscles that support your balance.  Your breasts will continue to grow and be tender.  Your gums may bleed and may be sensitive to brushing and flossing.  Dark spots or blotches (chloasma, mask of pregnancy)  may develop on your face. This will likely fade after the baby is born.  A dark line from your belly button to the pubic area (linea nigra) may appear. This will likely fade after the baby is born.  You may have changes in your hair. These can include thickening of your hair, rapid growth, and changes in texture. Some women also have hair loss during or after pregnancy, or hair that feels dry or thin. Your hair will most likely return to normal after your baby is born. WHAT TO EXPECT AT YOUR PRENATAL VISITS During a routine prenatal visit:  You will be weighed to make sure you and the fetus are growing normally.  Your blood pressure will be taken.  Your abdomen will be measured to track your baby's growth.  The fetal heartbeat will be listened to.  Any test results from the previous visit will be discussed. Your health care provider may ask you:  How you are feeling.  If you are feeling the baby move.  If you have had any abnormal symptoms, such as leaking fluid, bleeding, severe headaches, or abdominal cramping.  If you are using any tobacco products, including cigarettes, chewing tobacco, and electronic cigarettes.  If you have any questions. Other tests that may be performed during your second trimester include:  Blood tests that check for:  Low iron levels (anemia).  Gestational diabetes (between 24 and 28 weeks).  Rh antibodies.  Urine tests to check for infections, diabetes, or protein in the urine.  An ultrasound to confirm the proper growth and development of the baby.  An amniocentesis to check for possible genetic problems.  Fetal screens for spina bifida and Down syndrome.  HIV (human immunodeficiency virus) testing. Routine prenatal testing includes screening for HIV, unless you choose not to have this test. HOME CARE INSTRUCTIONS   Avoid all smoking, herbs, alcohol, and unprescribed drugs. These chemicals affect the formation and growth of the baby.  Do  not use any tobacco products, including cigarettes, chewing tobacco, and electronic cigarettes. If you need help quitting, ask your health care provider. You may receive counseling support and other resources to help you quit.  Follow your health care provider's instructions regarding medicine use. There are medicines that are either safe or unsafe to take during pregnancy.  Exercise only as directed by your health care provider. Experiencing uterine cramps is a good sign to stop exercising.  Continue to eat regular, healthy meals.  Wear a good support bra for breast tenderness.  Do not use hot tubs, steam rooms, or saunas.  Wear your seat belt at all times when driving.  Avoid raw meat, uncooked cheese, cat litter boxes, and soil used by cats. These carry germs that can cause birth defects in the baby.  Take your prenatal vitamins.  Take 1500-2000 mg of calcium daily starting at the 20th week of pregnancy until you deliver your baby.  Try taking a stool softener (if your health care provider approves) if you develop constipation. Eat more high-fiber foods, such as fresh vegetables or fruit and whole grains. Drink plenty of fluids to keep your urine clear or pale yellow.  Take warm sitz baths to soothe any pain  or discomfort caused by hemorrhoids. Use hemorrhoid cream if your health care provider approves.  If you develop varicose veins, wear support hose. Elevate your feet for 15 minutes, 3-4 times a day. Limit salt in your diet.  Avoid heavy lifting, wear low heel shoes, and practice good posture.  Rest with your legs elevated if you have leg cramps or low back pain.  Visit your dentist if you have not gone yet during your pregnancy. Use a soft toothbrush to brush your teeth and be gentle when you floss.  A sexual relationship may be continued unless your health care provider directs you otherwise.  Continue to go to all your prenatal visits as directed by your health care  provider. SEEK MEDICAL CARE IF:   You have dizziness.  You have mild pelvic cramps, pelvic pressure, or nagging pain in the abdominal area.  You have persistent nausea, vomiting, or diarrhea.  You have a bad smelling vaginal discharge.  You have pain with urination. SEEK IMMEDIATE MEDICAL CARE IF:   You have a fever.  You are leaking fluid from your vagina.  You have spotting or bleeding from your vagina.  You have severe abdominal cramping or pain.  You have rapid weight gain or loss.  You have shortness of breath with chest pain.  You notice sudden or extreme swelling of your face, hands, ankles, feet, or legs.  You have not felt your baby move in over an hour.  You have severe headaches that do not go away with medicine.  You have vision changes.   This information is not intended to replace advice given to you by your health care provider. Make sure you discuss any questions you have with your health care provider.   Document Released: 04/25/2001 Document Revised: 05/22/2014 Document Reviewed: 07/02/2012 Elsevier Interactive Patient Education [employer].

## 2015-04-07 NOTE — Progress Notes (Signed)
Subjective:  Catherine [name] is a 34 y.o. G4P0030 at [redacted]w[redacted]d being seen today for ongoing prenatal care.  She is currently monitored for the following issues for this high-risk pregnancy: Patient Active Problem List   Diagnosis Date Noted  . Hypertension affecting pregnancy, antepartum 03/25/2015  . Pregnancy with history of uterine myomectomy 03/25/2015  . Supervision of high risk pregnancy, antepartum 03/22/2015  . 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 Obesity 03/22/2015  . AMA (advanced maternal age) multigravida 35+ 03/22/2015   Patient reports no complaints.  Contractions: Not present. Vag. Bleeding: None.  Movement: Present. Denies leaking of fluid.   The following portions of the patient's history were reviewed and updated as appropriate: allergies, current medications, past family history, past medical history, past social history, past surgical history and problem list. Problem list updated.  Objective:   Filed Vitals:   04/07/15 0908  BP: 142/79  Pulse: 96  Temp: 98.4 F (36.9 C)  Weight: 278 lb (126.1 kg)    Fetal Status: Fetal Heart Rate (bpm): 148   Movement: Present     General:  Alert, oriented and cooperative. Patient is in no acute distress.  Skin: Skin is warm and dry. No rash noted.   Cardiovascular: Normal heart rate noted  Respiratory: Normal respiratory effort, no problems with respiration noted  Abdomen: Soft, gravid, appropriate for gestational age. Pain/Pressure: Absent     Pelvic: Vag. Bleeding: None     Cervical exam deferred        Extremities: Normal range of motion.  Edema: Moderate pitting, indentation subsides rapidly  Mental Status: Normal mood and affect. Normal behavior. Normal judgment and thought content.   Urinalysis: Urine Protein: Negative Urine Glucose: Negative  Assessment and Plan:  Pregnancy: G4P0030 at [redacted]w[redacted]d  1. Hypertension affecting pregnancy, antepartum, second trimester -Increased to 300mg  BID  2. Pregnancy with history of uterine  myomectomy -needs scheduled CS at 36-37 wks  3. AMA (advanced maternal age) multigravida 68+, second trimester  4. Supervision of high risk pregnancy, antepartum, second trimester Udpated box Strongly encouraged flu shot Recommended compression stockings and frequent breaks and leg elevation  Preterm labor symptoms and general obstetric precautions including but not limited to vaginal bleeding, contractions, leaking of fluid and fetal movement were reviewed in detail with the patient. Please refer to After Visit Summary for other counseling recommendations.  Return in about 4 weeks (around 05/05/2015) for Routine prenatal care.   Caren Macadam, MD

## 2015-04-07 NOTE — Progress Notes (Signed)
Reviewed tip of week with patient  

## 2015-04-12 ENCOUNTER — Encounter: Payer: Self-pay | Admitting: *Deleted

## 2015-05-06 ENCOUNTER — Ambulatory Visit (INDEPENDENT_AMBULATORY_CARE_PROVIDER_SITE_OTHER): Payer: Medicaid Other | Admitting: Obstetrics & Gynecology

## 2015-05-06 VITALS — BP 124/67 | HR 93 | Temp 98.3°F | Wt 280.5 lb

## 2015-05-06 DIAGNOSIS — O09522 Supervision of elderly multigravida, second trimester: Secondary | ICD-10-CM | POA: Diagnosis not present

## 2015-05-06 DIAGNOSIS — O98513 Other viral diseases complicating pregnancy, third trimester: Secondary | ICD-10-CM

## 2015-05-06 DIAGNOSIS — B009 Herpesviral infection, unspecified: Secondary | ICD-10-CM | POA: Diagnosis not present

## 2015-05-06 DIAGNOSIS — O162 Unspecified maternal hypertension, second trimester: Secondary | ICD-10-CM | POA: Diagnosis not present

## 2015-05-06 DIAGNOSIS — O0992 Supervision of high risk pregnancy, unspecified, second trimester: Secondary | ICD-10-CM

## 2015-05-06 LAB — POCT URINALYSIS DIP (DEVICE)
Bilirubin Urine: NEGATIVE
GLUCOSE, UA: NEGATIVE mg/dL
KETONES UR: NEGATIVE mg/dL
LEUKOCYTES UA: NEGATIVE
Nitrite: NEGATIVE
Protein, ur: NEGATIVE mg/dL
SPECIFIC GRAVITY, URINE: 1.02 (ref 1.005–1.030)
Urobilinogen, UA: 0.2 mg/dL (ref 0.0–1.0)
pH: 6 (ref 5.0–8.0)

## 2015-05-06 LAB — CBC
HEMATOCRIT: 32.6 % — AB (ref 36.0–46.0)
HEMOGLOBIN: 10.6 g/dL — AB (ref 12.0–15.0)
MCH: 25.4 pg — ABNORMAL LOW (ref 26.0–34.0)
MCHC: 32.5 g/dL (ref 30.0–36.0)
MCV: 78.2 fL (ref 78.0–100.0)
MPV: 8.8 fL (ref 8.6–12.4)
Platelets: 361 10*3/uL (ref 150–400)
RBC: 4.17 MIL/uL (ref 3.87–5.11)
RDW: 17.7 % — ABNORMAL HIGH (ref 11.5–15.5)
WBC: 10.1 10*3/uL (ref 4.0–10.5)

## 2015-05-06 MED ORDER — VALACYCLOVIR HCL 500 MG PO TABS: 500.0000 mg | ORAL_TABLET | Freq: Two times a day (BID) | ORAL | Status: AC

## 2015-05-06 NOTE — Progress Notes (Signed)
Subjective:  Catherine [name] is a 34 y.o. G4P0030 at [redacted]w[redacted]d being seen today for ongoing prenatal care.  She is currently monitored for the following issues for this high-risk pregnancy and has Supervision of high risk pregnancy, antepartum; Herpes simplex type 2 (HSV-2) infection affecting pregnancy, antepartum; Obesity; AMA (advanced maternal age) multigravida 74+; Hypertension affecting pregnancy, antepartum; and Pregnancy with history of uterine myomectomy on her problem list.  Patient reports no complaints.  Contractions: Not present. Vag. Bleeding: None.  Movement: Present. Denies leaking of fluid.   The following portions of the patient's history were reviewed and updated as appropriate: allergies, current medications, past family history, past medical history, past social history, past surgical history and problem list. Problem list updated.  Objective:   Filed Vitals:   05/06/15 0826  BP: 124/67  Pulse: 93  Temp: 98.3 F (36.8 C)  Weight: 280 lb 8 oz (127.234 kg)    Fetal Status: Fetal Heart Rate (bpm): 143 Fundal Height: 28 cm Movement: Present     General:  Alert, oriented and cooperative. Patient is in no acute distress.  Skin: Skin is warm and dry. No rash noted.   Cardiovascular: Normal heart rate noted  Respiratory: Normal respiratory effort, no problems with respiration noted  Abdomen: Soft, gravid, appropriate for gestational age. Pain/Pressure: Absent     Pelvic: Vag. Bleeding: None    Cervical exam deferred        Extremities: Normal range of motion.  Edema: Mild pitting, slight indentation  Mental Status: Normal mood and affect. Normal behavior. Normal judgment and thought content.   Urinalysis: Urine Protein: Negative Urine Glucose: Negative  Assessment and Plan:  Pregnancy: G4P0030 at [redacted]w[redacted]d  1. AMA (advanced maternal age) multigravida 44+, second trimester 2. Hypertension affecting pregnancy, antepartum, second trimester Growth scan ordered on 05/07/15.   Antenatal testing at 32 weeks.  Continue Labetalol and ASA. - Glucose Tolerance, 1 HR (50g) - CBC - HIV antibody - RPR  3. Herpes simplex type 2 (HSV-2) infection affecting pregnancy, antepartum, third trimester Suppression therapy ordered, patient desired refill - valACYclovir (VALTREX) 500 MG tablet; Take 1 tablet (500 mg total) by mouth 2 (two) times daily.  Dispense: 60 tablet; Refill: 6  4. Supervision of high risk pregnancy, antepartum, second trimester Preterm labor symptoms and general obstetric precautions including but not limited to vaginal bleeding, contractions, leaking of fluid and fetal movement were reviewed in detail with the patient. Please refer to After Visit Summary for other counseling recommendations.  Return in about 2 weeks (around 05/20/2015) for OB Visit.   Osborne Oman, MD

## 2015-05-06 NOTE — Progress Notes (Signed)
Edema- ankles  Educated pt on Benefits of Breastfeeding for Baby; Skin to Skin and info given

## 2015-05-06 NOTE — Patient Instructions (Addendum)
Return to clinic for any obstetric concerns or go to [facility] for evaluation AREA PEDIATRIC/FAMILY PRACTICE PHYSICIANS  [facility] [address] [place], [place] [address] Phone - [phone]   Fax - [place] [address] [place], [place]  [address] Phone - [phone]   Fax - [phone]  [place] [address] [place] [address] [place], [place]  [address] Phone - [phone]   Fax - [phone]  [facility] [address] [place], [place]  [address] Phone - [phone]   Fax - [phone]  [place] [address], [place], [place]  [address] Phone - [phone]   Fax - [place] [id number] [place], [place]  [address] Phone - [phone]   Fax - [place] [facility] [place] [address] [place], [place]  [address] Phone - [phone]   Fax - [phone]  [place] [address] [place], [place], [place]  [address] Phone - [phone]   Fax - [place] [address] [place], [place]  [address] Phone - [phone]   Fax - [phone] [facility] [place] [address] [place], [place]  [address] Phone - [phone]   Fax - [phone]  [place] [address] [place], [place]  [address] Phone - [phone]   Fax - [phone]  [facility] [place], [place], [place]  [address] Phone - [phone]   Fax - [place] [address] [place], [place]  [address] Phone - [phone]   Fax - [phone]  [facility] [address], [place], [place]  [address] Phone - [place], [place]  [address] Phone -  [phone]   Fax - [place] [address] [place], [place]  [address] Phone - [phone]   Fax - [phone]  [place] [address] [place], [place]  [address] Phone - [phone]   Fax - [place]. [place], [place]  [address] Phone - [phone]   Fax - [place], [place], [place]  [address] Phone - [phone]   Fax - [place] [address] [place], [place]  [address] Phone - [phone]   Fax - [phone]  [place] [address] [place], [place]  [address] Phone - [phone]   Fax - [place] [address], [place], [place]  [address] Phone - [phone]   Fax - [phone]  [place] [address] [place], [place]  [address] Phone - [phone]   Fax - [phone] [place] [id number] [place],   [address] Phone - [phone]   Fax - [place] [address] [place],   [address] Phone - [phone]   Fax - [place] [address], [place],   [address] Phone - [phone]   Fax - [phone]

## 2015-05-07 ENCOUNTER — Other Ambulatory Visit (HOSPITAL_COMMUNITY): Payer: Self-pay | Admitting: Maternal and Fetal Medicine

## 2015-05-07 ENCOUNTER — Encounter (HOSPITAL_COMMUNITY): Payer: Self-pay

## 2015-05-07 ENCOUNTER — Ambulatory Visit (HOSPITAL_COMMUNITY)
Admission: RE | Admit: 2015-05-07 | Discharge: 2015-05-07 | Disposition: A | Discharge status: Home / Self Care | Payer: Medicaid Other | Source: Ambulatory Visit | Attending: Nurse Practitioner | Admitting: Nurse Practitioner

## 2015-05-07 VITALS — BP 136/79 | HR 100 | Wt 282.8 lb

## 2015-05-07 DIAGNOSIS — Z3A26 26 weeks gestation of pregnancy: Secondary | ICD-10-CM

## 2015-05-07 DIAGNOSIS — O10012 Pre-existing essential hypertension complicating pregnancy, second trimester: Secondary | ICD-10-CM | POA: Diagnosis not present

## 2015-05-07 DIAGNOSIS — O3429 Maternal care due to uterine scar from other previous surgery: Secondary | ICD-10-CM

## 2015-05-07 DIAGNOSIS — O99212 Obesity complicating pregnancy, second trimester: Secondary | ICD-10-CM

## 2015-05-07 DIAGNOSIS — O10912 Unspecified pre-existing hypertension complicating pregnancy, second trimester: Secondary | ICD-10-CM

## 2015-05-07 DIAGNOSIS — O09522 Supervision of elderly multigravida, second trimester: Secondary | ICD-10-CM | POA: Diagnosis present

## 2015-05-07 DIAGNOSIS — O169 Unspecified maternal hypertension, unspecified trimester: Secondary | ICD-10-CM

## 2015-05-07 LAB — RPR

## 2015-05-07 LAB — HIV ANTIBODY (ROUTINE TESTING W REFLEX): HIV: NONREACTIVE

## 2015-05-07 LAB — GLUCOSE TOLERANCE, 1 HOUR (50G) W/O FASTING: Glucose, 1 Hour GTT: 109 mg/dL (ref 70–140)

## 2015-05-10 ENCOUNTER — Encounter (HOSPITAL_COMMUNITY): Payer: Self-pay | Admitting: Radiology

## 2015-05-10 ENCOUNTER — Inpatient Hospital Stay (HOSPITAL_COMMUNITY): Payer: Medicaid Other

## 2015-05-10 ENCOUNTER — Emergency Department (HOSPITAL_COMMUNITY): Payer: Medicaid Other

## 2015-05-10 ENCOUNTER — Observation Stay (HOSPITAL_COMMUNITY): Payer: Medicaid Other | Admitting: Anesthesiology

## 2015-05-10 ENCOUNTER — Encounter (HOSPITAL_COMMUNITY): Payer: Self-pay | Admitting: Anesthesiology

## 2015-05-10 ENCOUNTER — Inpatient Hospital Stay (HOSPITAL_COMMUNITY)
Admission: AD | Admit: 2015-05-10 | Discharge: 2015-05-13 | DRG: 765 | Disposition: A | Discharge status: Home / Self Care | Payer: Medicaid Other | Attending: Obstetrics and Gynecology | Admitting: Obstetrics and Gynecology

## 2015-05-10 ENCOUNTER — Encounter (HOSPITAL_COMMUNITY)
Admission: AD | Disposition: A | Discharge status: Home / Self Care | Payer: Self-pay | Source: Home / Self Care | Attending: Obstetrics and Gynecology

## 2015-05-10 DIAGNOSIS — Z6841 Body Mass Index (BMI) 40.0 and over, adult: Secondary | ICD-10-CM

## 2015-05-10 DIAGNOSIS — O99214 Obesity complicating childbirth: Secondary | ICD-10-CM | POA: Diagnosis present

## 2015-05-10 DIAGNOSIS — S3991XA Unspecified injury of abdomen, initial encounter: Secondary | ICD-10-CM | POA: Diagnosis present

## 2015-05-10 DIAGNOSIS — R0789 Other chest pain: Secondary | ICD-10-CM | POA: Diagnosis present

## 2015-05-10 DIAGNOSIS — Y92410 Unspecified street and highway as the place of occurrence of the external cause: Secondary | ICD-10-CM | POA: Diagnosis not present

## 2015-05-10 DIAGNOSIS — O9852 Other viral diseases complicating childbirth: Secondary | ICD-10-CM | POA: Diagnosis not present

## 2015-05-10 DIAGNOSIS — Z3490 Encounter for supervision of normal pregnancy, unspecified, unspecified trimester: Secondary | ICD-10-CM

## 2015-05-10 DIAGNOSIS — B009 Herpesviral infection, unspecified: Secondary | ICD-10-CM | POA: Diagnosis present

## 2015-05-10 DIAGNOSIS — O4593 Premature separation of placenta, unspecified, third trimester: Secondary | ICD-10-CM | POA: Diagnosis present

## 2015-05-10 DIAGNOSIS — O9A22 Injury, poisoning and certain other consequences of external causes complicating childbirth: Secondary | ICD-10-CM | POA: Diagnosis present

## 2015-05-10 DIAGNOSIS — Z3A26 26 weeks gestation of pregnancy: Secondary | ICD-10-CM

## 2015-05-10 DIAGNOSIS — O4592 Premature separation of placenta, unspecified, second trimester: Principal | ICD-10-CM | POA: Diagnosis present

## 2015-05-10 DIAGNOSIS — O1092 Unspecified pre-existing hypertension complicating childbirth: Secondary | ICD-10-CM | POA: Diagnosis present

## 2015-05-10 DIAGNOSIS — M79641 Pain in right hand: Secondary | ICD-10-CM | POA: Diagnosis present

## 2015-05-10 DIAGNOSIS — S3981XA Other specified injuries of abdomen, initial encounter: Secondary | ICD-10-CM

## 2015-05-10 DIAGNOSIS — O26892 Other specified pregnancy related conditions, second trimester: Secondary | ICD-10-CM | POA: Diagnosis present

## 2015-05-10 DIAGNOSIS — O4690 Antepartum hemorrhage, unspecified, unspecified trimester: Secondary | ICD-10-CM | POA: Diagnosis present

## 2015-05-10 DIAGNOSIS — R229 Localized swelling, mass and lump, unspecified: Secondary | ICD-10-CM

## 2015-05-10 DIAGNOSIS — O458X2 Other premature separation of placenta, second trimester: Secondary | ICD-10-CM | POA: Diagnosis not present

## 2015-05-10 DIAGNOSIS — O9A219 Injury, poisoning and certain other consequences of external causes complicating pregnancy, unspecified trimester: Secondary | ICD-10-CM

## 2015-05-10 DIAGNOSIS — O4692 Antepartum hemorrhage, unspecified, second trimester: Secondary | ICD-10-CM

## 2015-05-10 DIAGNOSIS — Z98891 History of uterine scar from previous surgery: Secondary | ICD-10-CM

## 2015-05-10 DIAGNOSIS — S8011XA Contusion of right lower leg, initial encounter: Secondary | ICD-10-CM | POA: Diagnosis present

## 2015-05-10 DIAGNOSIS — Z3A36 36 weeks gestation of pregnancy: Secondary | ICD-10-CM

## 2015-05-10 HISTORY — DX: Herpesviral infection, unspecified: B00.9

## 2015-05-10 HISTORY — DX: Benign neoplasm of connective and other soft tissue, unspecified: D21.9

## 2015-05-10 LAB — CBC
HCT: 32.7 % — ABNORMAL LOW (ref 36.0–46.0)
HCT: 30.6 % — ABNORMAL LOW (ref 36.0–46.0)
HEMOGLOBIN: 10.1 g/dL — AB (ref 12.0–15.0)
Hemoglobin: 10.7 g/dL — ABNORMAL LOW (ref 12.0–15.0)
MCH: 25.8 pg — ABNORMAL LOW (ref 26.0–34.0)
MCH: 26 pg (ref 26.0–34.0)
MCHC: 32.7 g/dL (ref 30.0–36.0)
MCHC: 33 g/dL (ref 30.0–36.0)
MCV: 79 fL (ref 78.0–100.0)
MCV: 78.7 fL (ref 78.0–100.0)
PLATELETS: 329 10*3/uL (ref 150–400)
Platelets: 331 10*3/uL (ref 150–400)
RBC: 4.14 MIL/uL (ref 3.87–5.11)
RBC: 3.89 MIL/uL (ref 3.87–5.11)
RDW: 17 % — ABNORMAL HIGH (ref 11.5–15.5)
RDW: 17.1 % — AB (ref 11.5–15.5)
WBC: 11.8 10*3/uL — AB (ref 4.0–10.5)
WBC: 15.3 10*3/uL — ABNORMAL HIGH (ref 4.0–10.5)

## 2015-05-10 LAB — PREPARE FRESH FROZEN PLASMA
UNIT DIVISION: 0
Unit division: 0

## 2015-05-10 LAB — COMPREHENSIVE METABOLIC PANEL
ALK PHOS: 83 U/L (ref 38–126)
ALT: 18 U/L (ref 14–54)
ANION GAP: 10 (ref 5–15)
AST: 25 U/L (ref 15–41)
Albumin: 3.1 g/dL — ABNORMAL LOW (ref 3.5–5.0)
BUN: 7 mg/dL (ref 6–20)
CALCIUM: 9.5 mg/dL (ref 8.9–10.3)
CO2: 21 mmol/L — ABNORMAL LOW (ref 22–32)
CREATININE: 0.66 mg/dL (ref 0.44–1.00)
Chloride: 105 mmol/L (ref 101–111)
Glucose, Bld: 138 mg/dL — ABNORMAL HIGH (ref 65–99)
Potassium: 4.3 mmol/L (ref 3.5–5.1)
Sodium: 136 mmol/L (ref 135–145)
TOTAL PROTEIN: 7.2 g/dL (ref 6.5–8.1)
Total Bilirubin: 0.4 mg/dL (ref 0.3–1.2)

## 2015-05-10 LAB — TYPE AND SCREEN
ABO/RH(D): O POS
ABO/RH(D): O POS
ANTIBODY SCREEN: NEGATIVE
Antibody Screen: NEGATIVE
UNIT DIVISION: 0
UNIT DIVISION: 0

## 2015-05-10 LAB — PROTIME-INR
INR: 1.06 (ref 0.00–1.49)
PROTHROMBIN TIME: 14 s (ref 11.6–15.2)

## 2015-05-10 LAB — ABO/RH
ABO/RH(D): O POS
ABO/RH(D): O POS

## 2015-05-10 LAB — FIBRINOGEN: FIBRINOGEN: 625 mg/dL — AB (ref 204–475)

## 2015-05-10 LAB — CDS SEROLOGY

## 2015-05-10 LAB — ETHANOL

## 2015-05-10 LAB — AMNISURE RUPTURE OF MEMBRANE (ROM) NOT AT ARMC: AMNISURE: NEGATIVE

## 2015-05-10 SURGERY — Surgical Case
Anesthesia: Spinal

## 2015-05-10 MED ORDER — PROMETHAZINE HCL 25 MG/ML IJ SOLN: 6.2500 mg | INTRAMUSCULAR | Status: DC | PRN

## 2015-05-10 MED ORDER — MORPHINE SULFATE (PF) 0.5 MG/ML IJ SOLN
INTRAMUSCULAR | Status: AC
  Filled 2015-05-10: qty 10

## 2015-05-10 MED ORDER — LACTATED RINGERS IV SOLN
INTRAVENOUS | Status: DC
  Administered 2015-05-11: 01:00:00 via INTRAVENOUS

## 2015-05-10 MED ORDER — CALCIUM CARBONATE ANTACID 500 MG PO CHEW
2.0000 | CHEWABLE_TABLET | ORAL | Status: DC | PRN
  Filled 2015-05-10: qty 2

## 2015-05-10 MED ORDER — MORPHINE SULFATE (PF) 4 MG/ML IV SOLN
6.0000 mg | Freq: Once | INTRAVENOUS | Status: AC
  Administered 2015-05-10: 6 mg via INTRAVENOUS
  Filled 2015-05-10: qty 2

## 2015-05-10 MED ORDER — EPHEDRINE SULFATE 50 MG/ML IJ SOLN
INTRAMUSCULAR | Status: DC | PRN
  Administered 2015-05-10: 10 mg via INTRAVENOUS

## 2015-05-10 MED ORDER — ONDANSETRON HCL 4 MG/2ML IJ SOLN
INTRAMUSCULAR | Status: AC
  Filled 2015-05-10: qty 2

## 2015-05-10 MED ORDER — BETAMETHASONE SOD PHOS & ACET 6 (3-3) MG/ML IJ SUSP
12.0000 mg | INTRAMUSCULAR | Status: DC
  Administered 2015-05-10: 12 mg via INTRAMUSCULAR
  Filled 2015-05-10 (×2): qty 2

## 2015-05-10 MED ORDER — HYDROMORPHONE HCL 1 MG/ML IJ SOLN: 0.2500 mg | INTRAMUSCULAR | Status: DC | PRN

## 2015-05-10 MED ORDER — PHENYLEPHRINE 8 MG IN D5W 100 ML (0.08MG/ML) PREMIX OPTIME
INJECTION | INTRAVENOUS | Status: AC
  Filled 2015-05-10: qty 100

## 2015-05-10 MED ORDER — ACETAMINOPHEN 325 MG PO TABS
650.0000 mg | ORAL_TABLET | ORAL | Status: DC | PRN
  Filled 2015-05-10: qty 2

## 2015-05-10 MED ORDER — DIBUCAINE 1 % RE OINT: 1.0000 "application " | TOPICAL_OINTMENT | RECTAL | Status: DC | PRN

## 2015-05-10 MED ORDER — DIPHENHYDRAMINE HCL 50 MG/ML IJ SOLN: 12.5000 mg | INTRAMUSCULAR | Status: DC | PRN

## 2015-05-10 MED ORDER — NALBUPHINE HCL 10 MG/ML IJ SOLN
5.0000 mg | INTRAMUSCULAR | Status: DC | PRN
Start: 2015-05-10 — End: 2015-05-13

## 2015-05-10 MED ORDER — KETOROLAC TROMETHAMINE 30 MG/ML IJ SOLN: 30.0000 mg | Freq: Four times a day (QID) | INTRAMUSCULAR | Status: DC | PRN

## 2015-05-10 MED ORDER — CEFAZOLIN SODIUM-DEXTROSE 2-3 GM-% IV SOLR
INTRAVENOUS | Status: DC | PRN
  Administered 2015-05-10: 2 g via INTRAVENOUS

## 2015-05-10 MED ORDER — EPHEDRINE SULFATE 50 MG/ML IJ SOLN
INTRAMUSCULAR | Status: AC
  Filled 2015-05-10: qty 1

## 2015-05-10 MED ORDER — DOCUSATE SODIUM 100 MG PO CAPS
100.0000 mg | ORAL_CAPSULE | Freq: Every day | ORAL | Status: DC
Start: 2015-05-10 — End: 2015-05-10
  Filled 2015-05-10: qty 1

## 2015-05-10 MED ORDER — NALOXONE HCL 2 MG/2ML IJ SOSY
1.0000 ug/kg/h | PREFILLED_SYRINGE | INTRAVENOUS | Status: DC | PRN
  Filled 2015-05-10: qty 2

## 2015-05-10 MED ORDER — LABETALOL HCL 200 MG PO TABS
300.0000 mg | ORAL_TABLET | Freq: Two times a day (BID) | ORAL | Status: DC
  Administered 2015-05-10: 300 mg via ORAL
  Filled 2015-05-10: qty 1

## 2015-05-10 MED ORDER — MORPHINE SULFATE (PF) 0.5 MG/ML IJ SOLN
INTRAMUSCULAR | Status: DC | PRN
  Administered 2015-05-10: .2 mg via INTRATHECAL

## 2015-05-10 MED ORDER — TETANUS-DIPHTH-ACELL PERTUSSIS 5-2.5-18.5 LF-MCG/0.5 IM SUSP: 0.5000 mL | Freq: Once | INTRAMUSCULAR | Status: DC

## 2015-05-10 MED ORDER — CEFAZOLIN SODIUM-DEXTROSE 2-3 GM-% IV SOLR
INTRAVENOUS | Status: AC
  Filled 2015-05-10: qty 50

## 2015-05-10 MED ORDER — OXYTOCIN 10 UNIT/ML IJ SOLN
40.0000 [IU] | INTRAMUSCULAR | Status: DC | PRN
  Administered 2015-05-10: 40 [IU] via INTRAVENOUS

## 2015-05-10 MED ORDER — FENTANYL CITRATE (PF) 100 MCG/2ML IJ SOLN
INTRAMUSCULAR | Status: AC
  Filled 2015-05-10: qty 2

## 2015-05-10 MED ORDER — NALOXONE HCL 0.4 MG/ML IJ SOLN: 0.4000 mg | INTRAMUSCULAR | Status: DC | PRN

## 2015-05-10 MED ORDER — SCOPOLAMINE 1 MG/3DAYS TD PT72
MEDICATED_PATCH | TRANSDERMAL | Status: AC
  Filled 2015-05-10: qty 1

## 2015-05-10 MED ORDER — NALBUPHINE HCL 10 MG/ML IJ SOLN: 5.0000 mg | INTRAMUSCULAR | Status: DC | PRN

## 2015-05-10 MED ORDER — OXYCODONE-ACETAMINOPHEN 5-325 MG PO TABS
2.0000 | ORAL_TABLET | ORAL | Status: DC | PRN
  Administered 2015-05-11: 2 via ORAL
  Filled 2015-05-10: qty 2

## 2015-05-10 MED ORDER — LACTATED RINGERS IV SOLN
INTRAVENOUS | Status: DC | PRN
  Administered 2015-05-10 (×2): via INTRAVENOUS

## 2015-05-10 MED ORDER — ONDANSETRON HCL 4 MG/2ML IJ SOLN: 4.0000 mg | Freq: Three times a day (TID) | INTRAMUSCULAR | Status: DC | PRN

## 2015-05-10 MED ORDER — SCOPOLAMINE 1 MG/3DAYS TD PT72
1.0000 | MEDICATED_PATCH | Freq: Once | TRANSDERMAL | Status: DC
  Filled 2015-05-10: qty 1

## 2015-05-10 MED ORDER — ONDANSETRON HCL 4 MG/2ML IJ SOLN
INTRAMUSCULAR | Status: DC | PRN
  Administered 2015-05-10: 4 mg via INTRAVENOUS

## 2015-05-10 MED ORDER — MEPERIDINE HCL 25 MG/ML IJ SOLN: 6.2500 mg | INTRAMUSCULAR | Status: DC | PRN

## 2015-05-10 MED ORDER — LANOLIN HYDROUS EX OINT: 1.0000 "application " | TOPICAL_OINTMENT | CUTANEOUS | Status: DC | PRN

## 2015-05-10 MED ORDER — FENTANYL CITRATE (PF) 100 MCG/2ML IJ SOLN
INTRAMUSCULAR | Status: DC | PRN
  Administered 2015-05-10: 12.5 ug via INTRATHECAL

## 2015-05-10 MED ORDER — OXYCODONE-ACETAMINOPHEN 5-325 MG PO TABS
1.0000 | ORAL_TABLET | ORAL | Status: DC | PRN
  Administered 2015-05-11 – 2015-05-13 (×5): 1 via ORAL
  Filled 2015-05-10 (×5): qty 1

## 2015-05-10 MED ORDER — IOHEXOL 300 MG/ML  SOLN
100.0000 mL | Freq: Once | INTRAMUSCULAR | Status: AC | PRN
  Administered 2015-05-10: 100 mL via INTRAVENOUS

## 2015-05-10 MED ORDER — SODIUM CHLORIDE 0.9 % IJ SOLN: 3.0000 mL | INTRAMUSCULAR | Status: DC | PRN

## 2015-05-10 MED ORDER — DIPHENHYDRAMINE HCL 25 MG PO CAPS: 25.0000 mg | ORAL_CAPSULE | ORAL | Status: DC | PRN

## 2015-05-10 MED ORDER — SCOPOLAMINE 1 MG/3DAYS TD PT72
MEDICATED_PATCH | TRANSDERMAL | Status: DC | PRN
  Administered 2015-05-10: 1 via TRANSDERMAL

## 2015-05-10 MED ORDER — ZOLPIDEM TARTRATE 5 MG PO TABS: 5.0000 mg | ORAL_TABLET | Freq: Every evening | ORAL | Status: DC | PRN

## 2015-05-10 MED ORDER — NALBUPHINE HCL 10 MG/ML IJ SOLN: 5.0000 mg | Freq: Once | INTRAMUSCULAR | Status: DC | PRN

## 2015-05-10 MED ORDER — ACETAMINOPHEN 500 MG PO TABS
1000.0000 mg | ORAL_TABLET | Freq: Four times a day (QID) | ORAL | Status: AC
  Administered 2015-05-11 (×3): 1000 mg via ORAL
  Filled 2015-05-10 (×5): qty 2

## 2015-05-10 MED ORDER — IBUPROFEN 600 MG PO TABS: 600.0000 mg | ORAL_TABLET | Freq: Four times a day (QID) | ORAL | Status: DC | PRN

## 2015-05-10 MED ORDER — IBUPROFEN 600 MG PO TABS
600.0000 mg | ORAL_TABLET | Freq: Four times a day (QID) | ORAL | Status: DC
  Administered 2015-05-11 – 2015-05-13 (×10): 600 mg via ORAL
  Filled 2015-05-10 (×11): qty 1

## 2015-05-10 MED ORDER — PHENYLEPHRINE 40 MCG/ML (10ML) SYRINGE FOR IV PUSH (FOR BLOOD PRESSURE SUPPORT)
PREFILLED_SYRINGE | INTRAVENOUS | Status: AC
  Filled 2015-05-10: qty 10

## 2015-05-10 MED ORDER — SIMETHICONE 80 MG PO CHEW: 80.0000 mg | CHEWABLE_TABLET | ORAL | Status: DC | PRN

## 2015-05-10 MED ORDER — BUPIVACAINE IN DEXTROSE 0.75-8.25 % IT SOLN
INTRATHECAL | Status: DC | PRN
  Administered 2015-05-10: 12 mg via INTRATHECAL

## 2015-05-10 MED ORDER — SIMETHICONE 80 MG PO CHEW
80.0000 mg | CHEWABLE_TABLET | ORAL | Status: DC
  Administered 2015-05-11 – 2015-05-12 (×3): 80 mg via ORAL
  Filled 2015-05-10 (×3): qty 1

## 2015-05-10 MED ORDER — SENNOSIDES-DOCUSATE SODIUM 8.6-50 MG PO TABS
2.0000 | ORAL_TABLET | ORAL | Status: DC
  Administered 2015-05-11 – 2015-05-12 (×3): 2 via ORAL
  Filled 2015-05-10 (×2): qty 2

## 2015-05-10 MED ORDER — PHENYLEPHRINE 8 MG IN D5W 100 ML (0.08MG/ML) PREMIX OPTIME
INJECTION | INTRAVENOUS | Status: DC | PRN
  Administered 2015-05-10: 60 ug/min via INTRAVENOUS

## 2015-05-10 MED ORDER — EPHEDRINE 5 MG/ML INJ
INTRAVENOUS | Status: AC
  Filled 2015-05-10: qty 10

## 2015-05-10 MED ORDER — MENTHOL 3 MG MT LOZG: 1.0000 | LOZENGE | OROMUCOSAL | Status: DC | PRN

## 2015-05-10 MED ORDER — SIMETHICONE 80 MG PO CHEW
80.0000 mg | CHEWABLE_TABLET | Freq: Three times a day (TID) | ORAL | Status: DC
  Administered 2015-05-11 – 2015-05-13 (×8): 80 mg via ORAL
  Filled 2015-05-10 (×9): qty 1

## 2015-05-10 MED ORDER — WITCH HAZEL-GLYCERIN EX PADS
1.0000 | MEDICATED_PAD | CUTANEOUS | Status: DC | PRN
Start: 2015-05-10 — End: 2015-05-13

## 2015-05-10 MED ORDER — SODIUM CHLORIDE 0.9 % IV BOLUS (SEPSIS)
500.0000 mL | Freq: Once | INTRAVENOUS | Status: AC
  Administered 2015-05-10: 500 mL via INTRAVENOUS

## 2015-05-10 MED ORDER — DIPHENHYDRAMINE HCL 25 MG PO CAPS: 25.0000 mg | ORAL_CAPSULE | Freq: Four times a day (QID) | ORAL | Status: DC | PRN

## 2015-05-10 MED ORDER — PRENATAL MULTIVITAMIN CH
1.0000 | ORAL_TABLET | Freq: Every day | ORAL | Status: DC
  Administered 2015-05-11 – 2015-05-13 (×3): 1 via ORAL
  Filled 2015-05-10 (×4): qty 1

## 2015-05-10 MED ORDER — KETOROLAC TROMETHAMINE 30 MG/ML IJ SOLN
30.0000 mg | Freq: Four times a day (QID) | INTRAMUSCULAR | Status: DC | PRN
  Administered 2015-05-10: 30 mg via INTRAMUSCULAR

## 2015-05-10 MED ORDER — VALACYCLOVIR HCL 500 MG PO TABS
500.0000 mg | ORAL_TABLET | Freq: Two times a day (BID) | ORAL | Status: DC
  Administered 2015-05-10: 500 mg via ORAL
  Filled 2015-05-10: qty 1

## 2015-05-10 MED ORDER — KETOROLAC TROMETHAMINE 30 MG/ML IJ SOLN: 30.0000 mg | Freq: Once | INTRAMUSCULAR | Status: DC | PRN

## 2015-05-10 MED ORDER — OXYTOCIN 40 UNITS IN LACTATED RINGERS INFUSION - SIMPLE MED: 62.5000 mL/h | INTRAVENOUS | Status: AC

## 2015-05-10 MED ORDER — ACETAMINOPHEN 325 MG PO TABS: 650.0000 mg | ORAL_TABLET | ORAL | Status: DC | PRN

## 2015-05-10 MED ORDER — OXYTOCIN 10 UNIT/ML IJ SOLN
INTRAMUSCULAR | Status: AC
  Filled 2015-05-10: qty 4

## 2015-05-10 SURGICAL SUPPLY — 34 items
APL SKNCLS STERI-STRIP NONHPOA (GAUZE/BANDAGES/DRESSINGS) ×1
BENZOIN TINCTURE PRP APPL 2/3 (GAUZE/BANDAGES/DRESSINGS) ×1 IMPLANT
BRR ADH 6X5 SEPRAFILM 1 SHT (MISCELLANEOUS)
CLAMP CORD UMBIL (MISCELLANEOUS) IMPLANT
CONTAINER PREFILL 10% NBF 15ML (MISCELLANEOUS) IMPLANT
DRAPE SHEET LG 3/4 BI-LAMINATE (DRAPES) IMPLANT
DRSG OPSITE POSTOP 4X10 (GAUZE/BANDAGES/DRESSINGS) ×2 IMPLANT
DURAPREP 26ML APPLICATOR (WOUND CARE) ×2 IMPLANT
ELECT REM PT RETURN 9FT ADLT (ELECTROSURGICAL) ×2
ELECTRODE REM PT RTRN 9FT ADLT (ELECTROSURGICAL) ×1 IMPLANT
EXTRACTOR VACUUM M CUP 4 TUBE (SUCTIONS) IMPLANT
GLOVE BIOGEL PI IND STRL 6.5 (GLOVE) ×1 IMPLANT
GLOVE BIOGEL PI IND STRL 7.0 (GLOVE) ×1 IMPLANT
GLOVE BIOGEL PI INDICATOR 6.5 (GLOVE) ×1
GLOVE BIOGEL PI INDICATOR 7.0 (GLOVE) ×1
GLOVE SURG SS PI 6.0 STRL IVOR (GLOVE) ×2 IMPLANT
GOWN STRL REUS W/TWL LRG LVL3 (GOWN DISPOSABLE) ×4 IMPLANT
KIT ABG SYR 3ML LUER SLIP (SYRINGE) IMPLANT
NDL HYPO 25X5/8 SAFETYGLIDE (NEEDLE) IMPLANT
NEEDLE HYPO 25X5/8 SAFETYGLIDE (NEEDLE) IMPLANT
NS IRRIG 1000ML POUR BTL (IV SOLUTION) ×2 IMPLANT
PACK C SECTION WH (CUSTOM PROCEDURE TRAY) ×2 IMPLANT
PAD OB MATERNITY 4.3X12.25 (PERSONAL CARE ITEMS) ×2 IMPLANT
PENCIL SMOKE EVAC W/HOLSTER (ELECTROSURGICAL) ×2 IMPLANT
RTRCTR C-SECT PINK 25CM LRG (MISCELLANEOUS) IMPLANT
SEPRAFILM MEMBRANE 5X6 (MISCELLANEOUS) IMPLANT
STRIP CLOSURE SKIN 1/2X4 (GAUZE/BANDAGES/DRESSINGS) ×1 IMPLANT
SUT PLAIN 0 NONE (SUTURE) IMPLANT
SUT PLAIN 2 0 (SUTURE) ×4
SUT PLAIN ABS 2-0 CT1 27XMFL (SUTURE) IMPLANT
SUT VIC AB 0 CT1 36 (SUTURE) ×8 IMPLANT
SUT VIC AB 4-0 KS 27 (SUTURE) ×2 IMPLANT
TOWEL OR 17X24 6PK STRL BLUE (TOWEL DISPOSABLE) ×2 IMPLANT
TRAY FOLEY CATH SILVER 14FR (SET/KITS/TRAYS/PACK) ×2 IMPLANT

## 2015-05-10 NOTE — ED Notes (Signed)
2 mg of MS wasted with CN [place]. Unable to waste on Pixys
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
## 2015-05-10 NOTE — ED Notes (Signed)
Family at beside. Family given emotional support. 

## 2015-05-10 NOTE — Transfer of Care (Signed)
Immediate Anesthesia Transfer of Care Note  Patient: Catherine [name]  Procedure(s) Performed: Procedure(s): CESAREAN SECTION (N/A)  Patient Location: PACU  Anesthesia Type:Spinal  Level of Consciousness: awake  Airway & Oxygen Therapy: Patient Spontanous Breathing  Post-op Assessment: Report given to RN  Post vital signs: Reviewed and stable  Last Vitals:  Filed Vitals:   05/10/15 1141 05/10/15 1701  BP: 141/85 142/69  Pulse: 97 98  Temp: 36.8 C 36.9 C  Resp: 18 18    Complications: No apparent anesthesia complications

## 2015-05-10 NOTE — ED Notes (Signed)
This note also relates to the following rows which could not be included: Pulse Rate - Cannot attach notes to unvalidated device data Resp - Cannot attach notes to unvalidated device data Xray at bedside. FHR monitor hand held.
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
## 2015-05-10 NOTE — Anesthesia Preprocedure Evaluation (Signed)
Anesthesia Evaluation  Patient identified by MRN, date of birth, ID band Patient awake    Reviewed: Allergy & Precautions, H&P , NPO status , Patient's Chart, lab work & pertinent test results  Airway Mallampati: II  TM Distance: >3 FB Neck ROM: full    Dental no notable dental hx.    Pulmonary neg pulmonary ROS,    Pulmonary exam normal        Cardiovascular hypertension, Pt. on home beta blockers Normal cardiovascular exam     Neuro/Psych negative neurological ROS  negative psych ROS   GI/Hepatic negative GI ROS, Neg liver ROS,   Endo/Other  Morbid obesity  Renal/GU negative Renal ROS     Musculoskeletal   Abdominal (+) + obese,   Peds  Hematology negative hematology ROS (+)   Anesthesia Other Findings   Reproductive/Obstetrics (+) Pregnancy                             Anesthesia Physical Anesthesia Plan  ASA: III and emergent  Anesthesia Plan: Spinal   Post-op Pain Management:    Induction:   Airway Management Planned:   Additional Equipment:   Intra-op Plan:   Post-operative Plan:   Informed Consent: I have reviewed the patients History and Physical, chart, labs and discussed the procedure including the risks, benefits and alternatives for the proposed anesthesia with the patient or authorized representative who has indicated his/her understanding and acceptance.     Plan Discussed with: CRNA and Surgeon  Anesthesia Plan Comments:         Anesthesia Quick Evaluation
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
## 2015-05-10 NOTE — ED Notes (Signed)
Upgraded to level 1 @ 0248

## 2015-05-10 NOTE — Anesthesia Postprocedure Evaluation (Signed)
Anesthesia Post Note  Patient: Catherine [name]  Procedure(s) Performed: Procedure(s) (LRB): CESAREAN SECTION (N/A)  Patient location during evaluation: PACU Anesthesia Type: Spinal Level of consciousness: awake Pain management: pain level controlled Vital Signs Assessment: post-procedure vital signs reviewed and stable Respiratory status: spontaneous breathing Cardiovascular status: stable Postop Assessment: no headache, no backache, spinal receding, patient able to bend at knees and no signs of nausea or vomiting Anesthetic complications: no    Last Vitals:  Filed Vitals:   05/10/15 2030 05/10/15 2045  BP: 156/71 151/81  Pulse: 86 94  Temp:  36.8 C  Resp: 16 21    Last Pain:  Filed Vitals:   05/10/15 2057  PainSc: [name]
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
## 2015-05-10 NOTE — ED Notes (Signed)
FHR monitor hand held during IV start and evaluation. Unable to place straps due to pt dioscomfort.
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
## 2015-05-10 NOTE — MAU Note (Signed)
Patient arrived via [facility]. 20 G iv's noted in bilatral Ac's; left locked; right has 0.9% NS infusing; WNL. FHR 166's.
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
## 2015-05-10 NOTE — Op Note (Signed)
Jolayne Haines J Roussell PROCEDURE DATE: 05/10/2015  PREOPERATIVE DIAGNOSIS: Intrauterine pregnancy at  [redacted]w[redacted]d weeks gestation; abruptio placenta  POSTOPERATIVE DIAGNOSIS: The same  PROCEDURE:     Cesarean Section  SURGEON:  Dr. Mora Bellman  ASSISTANT: Dr. Laurey Arrow  INDICATIONS: Catherine [name] is a 34 y.o. G4P0 at [redacted]w[redacted]d scheduled for cesarean section secondary to abruptio placenta.  The risks of cesarean section discussed with the patient included but were not limited to: bleeding which may require transfusion or reoperation; infection which may require antibiotics; injury to bowel, bladder, ureters or other surrounding organs; injury to the fetus; need for additional procedures including hysterectomy in the event of a life-threatening hemorrhage; placental abnormalities wth subsequent pregnancies, incisional problems, thromboembolic phenomenon and other postoperative/anesthesia complications. The patient concurred with the proposed plan, giving informed written consent for the procedure.    FINDINGS:  Viable female infant in frank breech presentation.  Bloody amniotic fluid.  Intact placenta with evacuation of large size clots, three vessel cord.  Normal uterus, fallopian tubes and ovaries bilaterally.  ANESTHESIA:    Spinal INTRAVENOUS FLUIDS:1600 ml ESTIMATED BLOOD LOSS: 800 ml URINE OUTPUT:  30 ml SPECIMENS: Placenta sent to pathology COMPLICATIONS: None immediate  PROCEDURE IN DETAIL:  The patient received intravenous antibiotics and had sequential compression devices applied to her lower extremities while in the preoperative area.  She was then taken to the operating room where anesthesia was induced and was found to be adequate. A foley catheter was placed into her bladder and attached to Kimsey Demaree gravity. She was then placed in a dorsal supine position with a leftward tilt, and prepped and draped in a sterile manner. After an adequate timeout was performed, a Pfannenstiel skin incision was made  with scalpel and carried through to the underlying layer of fascia. The fascia was incised in the midline and this incision was extended bilaterally using the Mayo scissors. Kocher clamps were applied to the superior aspect of the fascial incision and the underlying rectus muscles were dissected off bluntly. A similar process was carried out on the inferior aspect of the facial incision. The rectus muscles were separated in the midline bluntly and the peritoneum was entered bluntly. The Alexis self-retaining retractor was introduced into the abdominal cavity. Attention was turned to the lower uterine segment which was developed enough and a transverse hysterotomy was made with a scalpel and extended bilaterally bluntly. The infant was successfully delivered, and cord was clamped and cut and infant was handed over to awaiting neonatology team. Uterine massage was then administered and the placenta delivered intact with three-vessel cord. The uterus was cleared of clot and debris.  The hysterotomy was closed with 0 Vicryl in a running locked fashion, and 2 imbricating layer was also placed with a 0 Vicryl. Overall, excellent hemostasis was noted. The pelvis copiously irrigated and cleared of all clot and debris. Hemostasis was confirmed on all surfaces.  The peritoneum and the muscles were reapproximated using 0 vicryl interrupted stitches. The fascia was then closed using 0 Vicryl in a running fashion.  The subcutaneous layer was reapproximated with plain gut and the skin was closed in a subcuticular fashion using 3.0 Vicryl. The patient tolerated the procedure well. Sponge, lap, instrument and needle counts were correct x 2. She was taken to the recovery room in stable condition.    Mattia Liford,PEGGYMD  05/10/2015 7:35 PM

## 2015-05-10 NOTE — ED Notes (Signed)
Family updated as to patient's status.

## 2015-05-10 NOTE — Anesthesia Procedure Notes (Signed)
Spinal Patient location during procedure: OR Start time: 05/10/2015 6:42 PM End time: 05/10/2015 6:46 PM Staffing Anesthesiologist: Lyn Hollingshead Performed by: anesthesiologist  Preanesthetic Checklist Completed: patient identified, surgical consent, pre-op evaluation, timeout performed, IV checked, risks and benefits discussed and monitors and equipment checked Spinal Block Patient position: sitting Prep: site prepped and draped and DuraPrep Patient monitoring: heart rate, cardiac monitor, continuous pulse ox and blood pressure Approach: midline Location: L3-4 Injection technique: single-shot Needle Needle type: Pencan  Needle gauge: 24 G Needle length: 9 cm Needle insertion depth: 7 cm Assessment Sensory level: T6

## 2015-05-11 ENCOUNTER — Ambulatory Visit (HOSPITAL_COMMUNITY): Payer: Medicaid Other

## 2015-05-11 ENCOUNTER — Encounter (HOSPITAL_COMMUNITY): Payer: Self-pay | Admitting: Obstetrics and Gynecology

## 2015-05-11 LAB — CBC
HCT: 25.6 % — ABNORMAL LOW (ref 36.0–46.0)
HEMOGLOBIN: 8.5 g/dL — AB (ref 12.0–15.0)
MCH: 25.8 pg — ABNORMAL LOW (ref 26.0–34.0)
MCHC: 33.2 g/dL (ref 30.0–36.0)
MCV: 77.6 fL — ABNORMAL LOW (ref 78.0–100.0)
Platelets: 312 10*3/uL (ref 150–400)
RBC: 3.3 MIL/uL — AB (ref 3.87–5.11)
RDW: 17 % — ABNORMAL HIGH (ref 11.5–15.5)
WBC: 20.2 10*3/uL — ABNORMAL HIGH (ref 4.0–10.5)

## 2015-05-11 LAB — RAPID HIV SCREEN (HIV 1/2 AB+AG)
HIV 1/2 ANTIBODIES: NONREACTIVE
HIV-1 P24 ANTIGEN - HIV24: NONREACTIVE

## 2015-05-11 LAB — HEPATITIS B SURFACE ANTIGEN: HEP B S AG: NEGATIVE

## 2015-05-11 MED ORDER — DILTIAZEM HCL ER COATED BEADS 180 MG PO CP24
180.0000 mg | ORAL_CAPSULE | Freq: Every day | ORAL | Status: DC
  Administered 2015-05-11 – 2015-05-13 (×3): 180 mg via ORAL
  Filled 2015-05-11 (×4): qty 1

## 2015-05-11 MED ORDER — FERROUS SULFATE 325 (65 FE) MG PO TABS
325.0000 mg | ORAL_TABLET | Freq: Every day | ORAL | Status: DC
  Administered 2015-05-12: 325 mg via ORAL
  Filled 2015-05-11: qty 1

## 2015-05-11 NOTE — Addendum Note (Signed)
Addendum  created 05/11/15 0805 by Raenette Rover, CRNA   Modules edited: Clinical Notes   Clinical Notes:  File: [id number]
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
## 2015-05-11 NOTE — Anesthesia Postprocedure Evaluation (Signed)
Anesthesia Post Note  Patient: Catherine [name]  Procedure(s) Performed: Procedure(s) (LRB): CESAREAN SECTION (N/A)  Patient location during evaluation: Mother Baby Anesthesia Type: Spinal Level of consciousness: awake, awake and alert, oriented and patient cooperative Pain management: pain level controlled Vital Signs Assessment: post-procedure vital signs reviewed and stable Respiratory status: spontaneous breathing, nonlabored ventilation and respiratory function stable Cardiovascular status: stable Postop Assessment: no headache, no backache, patient able to bend at knees and no signs of nausea or vomiting Anesthetic complications: no    Last Vitals:  Filed Vitals:   05/11/15 0031 05/11/15 0430  BP: 145/81 141/61  Pulse: 94 91  Temp:  36.8 C  Resp:  20    Last Pain:  Filed Vitals:   05/11/15 0640  PainSc: 4                  Saira Kramme L

## 2015-05-11 NOTE — Progress Notes (Signed)
Subjective: Postpartum Day 1: Cesarean Delivery Patient reports incisional pain, tolerating PO, + flatus and no problems voiding.    Objective: Vital signs in last 24 hours: Temp:  [97.7 F (36.5 C)-98.5 F (36.9 C)] 98.2 F (36.8 C) (12/27 0430) Pulse Rate:  [74-100] 91 (12/27 0430) Resp:  [16-25] 20 (12/27 0430) BP: (126-165)/(61-90) 141/61 mmHg (12/27 0430) SpO2:  [97 %-98 %] 98 % (12/27 0430) Weight:  [282 lb (127.914 kg)] 282 lb (127.914 kg) (12/26 0744)  Physical Exam:  General: alert, cooperative, appears stated age and no distress Lochia: appropriate Uterine Fundus: firm Incision: healing well, no significant drainage, no dehiscence, no significant erythema DVT Evaluation: No evidence of DVT seen on physical exam. Negative Homan's sign. No cords or calf tenderness.   Recent Labs  05/10/15 0945 05/11/15 0605  HGB 10.1* 8.5*  HCT 30.6* 25.6*    Assessment/Plan: Status post Cesarean section. Doing well postoperatively.  Continue current care.  Catherine [name] 05/11/2015, 6:51 AM
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
## 2015-05-11 NOTE — Lactation Note (Signed)
This note was copied from the chart of [name]. Lactation Consultation Note Checking on NICU mom to make sure she was pumping every 3 hours. Mom stated she was but nothing was coming out. Explained about colostrum being thick, and mainly pumping right now is for breast stimulation. When colostrum comings out she can give it to the baby. Had lots of company and mom was eager to go see her baby.  Patient Name: Catherine [name] [id number] Date: 05/11/2015 Reason for consult: Initial assessment   Maternal Data    Feeding    LATCH Score/Interventions                      Lactation Tools Discussed/Used Initiated by:: RN Date initiated:: 05/10/15   Consult Status Consult Status: Follow-up Date: 05/11/15 Follow-up type: In-patient    Chenel Wernli, Elta Gu[place] 05/11/2015, 8:05 AM

## 2015-05-11 NOTE — Lactation Note (Signed)
This note was copied from the chart of [place]. Lactation Consultation Note  Patient Name: Catherine [name] [id number] Date: 05/11/2015 Reason for consult: Follow-up assessment;NICU baby  NICU baby 43 hours old, [redacted]w[redacted]d CGA. Mom reports that she is pumping roughly every 3 hours, but not seeing anything. Discussed normal progression on milk coming to volume. Enc mom to pump 8 times/24 hours followed by hand expression. Assisted mom to hand express, with no colostrum flowing. However, mom had some dried colostrum on tip of nipple. Enc collected colostrum and taking to NICU as able. Reviewed NICU booklet and [place] brochure. Mom aware of OP/BFSG and [place] phone line assistance after D/C. Mom also aware of pumping rooms in NICU. Mom has large pendulous breasts with flat nipples. Demonstrated to mom how to support her breasts with rolled wash cloths. Refitted mom with #21 flanges and mom reported increased comfort. Mom gave permission to send BF referral to [facility] office, and it was faxed over.  Maternal Data Has patient been taught Hand Expression?: Yes Does the patient have breastfeeding experience prior to this delivery?: No  Feeding    LATCH Score/Interventions                      Lactation Tools Discussed/Used WIC Program: Yes   Consult Status Consult Status: Follow-up Date: 05/12/15 Follow-up type: In-patient    Inocente Salles 05/11/2015, 6:40 PM

## 2015-05-12 DIAGNOSIS — Z98891 History of uterine scar from previous surgery: Secondary | ICD-10-CM

## 2015-05-12 DIAGNOSIS — O4593 Premature separation of placenta, unspecified, third trimester: Secondary | ICD-10-CM | POA: Diagnosis present

## 2015-05-12 LAB — RUBELLA SCREEN: Rubella: 8.56 index (ref >0.99)

## 2015-05-12 LAB — RPR: RPR Ser Ql: NONREACTIVE

## 2015-05-12 MED ORDER — FERROUS SULFATE 325 (65 FE) MG PO TABS
325.0000 mg | ORAL_TABLET | Freq: Two times a day (BID) | ORAL | Status: DC
Start: 2015-05-12 — End: 2015-05-13
  Administered 2015-05-12 – 2015-05-13 (×2): 325 mg via ORAL
  Filled 2015-05-12 (×2): qty 1

## 2015-05-12 MED ORDER — VALACYCLOVIR HCL 500 MG PO TABS
500.0000 mg | ORAL_TABLET | Freq: Two times a day (BID) | ORAL | Status: DC
  Administered 2015-05-12 – 2015-05-13 (×2): 500 mg via ORAL
  Filled 2015-05-12 (×2): qty 1

## 2015-05-12 MED ORDER — CYCLOBENZAPRINE HCL 5 MG PO TABS
5.0000 mg | ORAL_TABLET | Freq: Three times a day (TID) | ORAL | Status: DC | PRN
Start: 2015-05-12 — End: 2015-05-13
  Filled 2015-05-12: qty 1
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
## 2015-05-12 NOTE — Lactation Note (Signed)
This note was copied from the chart of [place]. Lactation Consultation Note  Patient Name: Catherine [name] [id number] Date: 05/12/2015 Reason for consult: Follow-up assessment;NICU baby NICU baby 67 hours old. Mom reports that she has had a difficult morning and has not started pumping for the day. Enc mom to pump every 2-3 hours for 15 minutes except for a 5-hour stretch for sleep. Enc mom to hand express after pumping. Mom states that she took colostrum to NICU a couple of times last night. Mom reports that [facility] has been in contact with her about getting a DEBP on day of discharge.   Maternal Data    Feeding    LATCH Score/Interventions                      Lactation Tools Discussed/Used     Consult Status      Inocente Salles 05/12/2015, 10:59 AM

## 2015-05-13 MED ORDER — DILTIAZEM HCL ER COATED BEADS 180 MG PO CP24: 180.0000 mg | ORAL_CAPSULE | Freq: Every day | ORAL | Status: AC

## 2015-05-13 MED ORDER — IBUPROFEN 600 MG PO TABS: 600.0000 mg | ORAL_TABLET | Freq: Four times a day (QID) | ORAL | Status: AC

## 2015-05-13 MED ORDER — OXYCODONE-ACETAMINOPHEN 5-325 MG PO TABS: 1.0000 | ORAL_TABLET | ORAL | Status: AC | PRN

## 2015-05-13 MED ORDER — CYCLOBENZAPRINE HCL 5 MG PO TABS: 5.0000 mg | ORAL_TABLET | Freq: Three times a day (TID) | ORAL | Status: AC | PRN
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
## 2015-05-13 NOTE — Discharge Instructions (Signed)
Cesarean Delivery, Care After  Refer to this sheet in the next few weeks. These instructions provide you with information on caring for yourself after your procedure. Your health care provider may also give you specific instructions. Your treatment has been planned according to current medical practices, but problems sometimes occur. Call your health care provider if you have any problems or questions after you go home.  HOME CARE INSTRUCTIONS   Only take over-the-counter or prescription medications as directed by your health care provider.   Do not drink alcohol, especially if you are breastfeeding or taking medication to relieve pain.   Do not chew or smoke tobacco.   Continue to use good perineal care. Good perineal care includes:    Wiping your perineum from front to back.    Keeping your perineum clean.   Check your surgical cut (incision) daily for increased redness, drainage, swelling, or separation of skin.   Clean your incision gently with soap and water every day, and then pat it dry. If your health care provider says it is okay, leave the incision uncovered. Use a bandage (dressing) if the incision is draining fluid or appears irritated. If the adhesive strips across the incision do not fall off within 7 days, carefully peel them off.   Hug a pillow when coughing or sneezing until your incision is healed. This helps to relieve pain.   Do not use tampons or douche until your health care provider says it is okay.   Shower, wash your hair, and take tub baths as directed by your health care provider.   Wear a well-fitting bra that provides breast support.   Limit wearing support panties or control-top hose.   Drink enough fluids to keep your urine clear or pale yellow.   Eat high-fiber foods such as whole grain cereals and breads, brown rice, beans, and fresh fruits and vegetables every day. These foods may help prevent or relieve constipation.   Resume activities such as climbing stairs,  driving, lifting, exercising, or traveling as directed by your health care provider.   Talk to your health care provider about resuming sexual activities. This is dependent upon your risk of infection, your rate of healing, and your comfort and desire to resume sexual activity.   Try to have someone help you with your household activities and your newborn for at least a few days after you leave the hospital.   Rest as much as possible. Try to rest or take a nap when your newborn is sleeping.   Increase your activities gradually.   Keep all of your scheduled postpartum appointments. It is very important to keep your scheduled follow-up appointments. At these appointments, your health care provider will be checking to make sure that you are healing physically and emotionally.  SEEK MEDICAL CARE IF:    You are passing large clots from your vagina. Save any clots to show your health care provider.   You have a foul smelling discharge from your vagina.   You have trouble urinating.   You are urinating frequently.   You have pain when you urinate.   You have a change in your bowel movements.   You have increasing redness, pain, or swelling near your incision.   You have pus draining from your incision.   Your incision is separating.   You have painful, hard, or reddened breasts.   You have a severe headache.   You have blurred vision or see spots.   You feel sad   or depressed.   You have thoughts of hurting yourself or your newborn.   You have questions about your care, the care of your newborn, or medications.   You are dizzy or light-headed.   You have a rash.   You have pain, redness, or swelling at the site of the removed intravenous access (IV) tube.   You have nausea or vomiting.   You stopped breastfeeding and have not had a menstrual period within 12 weeks of stopping.   You are not breastfeeding and have not had a menstrual period within 12 weeks of delivery.   You have a fever.  SEEK  IMMEDIATE MEDICAL CARE IF:   You have persistent pain.   You have chest pain.   You have shortness of breath.   You faint.   You have leg pain.   You have stomach pain.   Your vaginal bleeding saturates 2 or more sanitary pads in 1 hour.  MAKE SURE YOU:    Understand these instructions.   Will watch your condition.   Will get help right away if you are not doing well or get worse.     This information is not intended to replace advice given to you by your health care provider. Make sure you discuss any questions you have with your health care provider.     Document Released: 01/21/2002 Document Revised: 05/22/2014 Document Reviewed: 12/27/2011  [employer]vier Interactive Patient Education 2016 [employer]
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
## 2015-05-13 NOTE — Progress Notes (Signed)
Received consult from pt RN who reports pt is getting ready to be discharged and did want to speak to a chaplain without her family present (per Chaplain Katy Claussen's note) before leaving.  Pt has family in room and needs to shower and speak with lactation before discharge.  Will follow up later this morning.
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
## 2015-05-18 ENCOUNTER — Encounter (HOSPITAL_COMMUNITY): Payer: Self-pay

## 2015-05-20 ENCOUNTER — Encounter: Payer: Medicaid Other | Admitting: Family

## 2015-05-25 ENCOUNTER — Encounter (HOSPITAL_COMMUNITY): Payer: Self-pay | Admitting: *Deleted

## 2015-05-26 ENCOUNTER — Ambulatory Visit: Payer: Self-pay
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
## 2015-06-02 ENCOUNTER — Ambulatory Visit (HOSPITAL_COMMUNITY): Payer: Medicaid Other

## 2015-06-18 ENCOUNTER — Ambulatory Visit (INDEPENDENT_AMBULATORY_CARE_PROVIDER_SITE_OTHER): Payer: Medicaid Other | Admitting: Family Medicine

## 2015-06-18 DIAGNOSIS — Q831 Accessory breast: Secondary | ICD-10-CM

## 2015-06-18 NOTE — Progress Notes (Signed)
  Subjective:     Catherine [name] is a 35 y.o. female who presents for a postpartum visit. She is 5 weeks postpartum following a low cervical transverse Cesarean section. I have fully reviewed the prenatal and intrapartum course. The delivery was at 27 gestational weeks. Outcome: primary cesarean section, low transverse incision. Anesthesia: spinal. Postpartum course has been uncomplicated. Baby's course has been complicated by prematurity. Baby is feeding by breast. Bleeding no bleeding. Bowel function is normal. Bladder function is normal. Patient is not sexually active. Contraception method is condoms. Postpartum depression screening: negative.  She has right sided breast tenderness  The following portions of the patient's history were reviewed and updated as appropriate: allergies, current medications, past family history, past medical history, past social history, past surgical history and problem list.  Review of Systems Pertinent items are noted in HPI.   Objective:    BP 143/83 mmHg  Pulse 79  Temp(Src) 97.9 F (36.6 C) (Oral)  Resp 18  Wt 270 lb (122.471 kg)  LMP 06/17/2015  General:  alert, cooperative and no distress   Breasts:  examination of the right breast shows a supernumerary nipple in the right upper outer area of the breast with 6cm firm tissue behind it.  There is no erythema.  Lungs: clear to auscultation bilaterally  Heart:  regular rate and rhythm, S1, S2 normal, no murmur, click, rub or gallop  Abdomen: soft, non-tender; bowel sounds normal; no masses,  no organomegaly.  Small 17mm area of the right lateral edge of the cesarean incision that is open, but is superficial.  Silver nitrate used to cauterize this area.        Assessment:     Normal postpartum exam. Supernumerary nipple.  Pap smear not done at today's visit.   Plan:    1. Contraception: condoms 2. Warm compresses for breast tissue.  Return if worsens. 3. Follow up in: 1 year or as needed.
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
## 2015-06-18 NOTE — Patient Instructions (Signed)
Postpartum Depression and Baby Blues °The postpartum period begins right after the birth of a baby. During this time, there is often a great amount of joy and excitement. It is also a time of many changes in the life of the parents. Regardless of how many times a mother gives birth, each child brings new challenges and dynamics to the family. It is not unusual to have feelings of excitement along with confusing shifts in moods, emotions, and thoughts. All mothers are at risk of developing postpartum depression or the "baby blues." These mood changes can occur right after giving birth, or they may occur many months after giving birth. The baby blues or postpartum depression can be mild or severe. Additionally, postpartum depression can go away rather quickly, or it can be a long-term condition.  °CAUSES °Raised hormone levels and the rapid drop in those levels are thought to be a main cause of postpartum depression and the baby blues. A number of hormones change during and after pregnancy. Estrogen and progesterone usually decrease right after the delivery of your baby. The levels of thyroid hormone and various cortisol steroids also rapidly drop. Other factors that play a role in these mood changes include major life events and genetics.  °RISK FACTORS °If you have any of the following risks for the baby blues or postpartum depression, know what symptoms to watch out for during the postpartum period. Risk factors that may increase the likelihood of getting the baby blues or postpartum depression include: °· Having a personal or family history of depression.   °· Having depression while being pregnant.   °· Having premenstrual mood issues or mood issues related to oral contraceptives. °· Having a lot of life stress.   °· Having marital conflict.   °· Lacking a social support network.   °· Having a baby with special needs.   °· Having health problems, such as diabetes.   °SIGNS AND SYMPTOMS °Symptoms of baby blues  include: °· Brief changes in mood, such as going from extreme happiness to sadness. °· Decreased concentration.   °· Difficulty sleeping.   °· Crying spells, tearfulness.   °· Irritability.   °· Anxiety.   °Symptoms of postpartum depression typically begin within the first month after giving birth. These symptoms include: °· Difficulty sleeping or excessive sleepiness.   °· Marked weight loss.   °· Agitation.   °· Feelings of worthlessness.   °· Lack of interest in activity or food.   °Postpartum psychosis is a very serious condition and can be dangerous. Fortunately, it is rare. Displaying any of the following symptoms is cause for immediate medical attention. Symptoms of postpartum psychosis include:  °· Hallucinations and delusions.   °· Bizarre or disorganized behavior.   °· Confusion or disorientation.   °DIAGNOSIS  °A diagnosis is made by an evaluation of your symptoms. There are no medical or lab tests that lead to a diagnosis, but there are various questionnaires that a health care provider may use to identify those with the baby blues, postpartum depression, or psychosis. Often, a screening tool called the Edinburgh Postnatal Depression Scale is used to diagnose depression in the postpartum period.  °TREATMENT °The baby blues usually goes away on its own in 1-2 weeks. Social support is often all that is needed. You will be encouraged to get adequate sleep and rest. Occasionally, you may be given medicines to help you sleep.  °Postpartum depression requires treatment because it can last several months or longer if it is not treated. Treatment may include individual or group therapy, medicine, or both to address any social, physiological, and psychological   factors that may play a role in the depression. Regular exercise, a healthy diet, rest, and social support may also be strongly recommended.  °Postpartum psychosis is more serious and needs treatment right away. Hospitalization is often needed. °HOME CARE  INSTRUCTIONS °· Get as much rest as you can. Nap when the baby sleeps.   °· Exercise regularly. Some women find yoga and walking to be beneficial.   °· Eat a balanced and nourishing diet.   °· Do little things that you enjoy. Have a cup of tea, take a bubble bath, read your favorite magazine, or listen to your favorite music. °· Avoid alcohol.   °· Ask for help with household chores, cooking, grocery shopping, or running errands as needed. Do not try to do everything.   °· Talk to people close to you about how you are feeling. Get support from your partner, family members, friends, or other new moms. °· Try to stay positive in how you think. Think about the things you are grateful for.   °· Do not spend a lot of time alone.   °· Only take over-the-counter or prescription medicine as directed by your health care provider. °· Keep all your postpartum appointments.   °· Let your health care provider know if you have any concerns.   °SEEK MEDICAL CARE IF: °You are having a reaction to or problems with your medicine. °SEEK IMMEDIATE MEDICAL CARE IF: °· You have suicidal feelings.   °· You think you may harm the baby or someone else. °MAKE SURE YOU: °· Understand these instructions. °· Will watch your condition. °· Will get help right away if you are not doing well or get worse. °  °This information is not intended to replace advice given to you by your health care provider. Make sure you discuss any questions you have with your health care provider. °  °Document Released: 02/03/2004 Document Revised: 05/06/2013 Document Reviewed: 02/10/2013 °Elsevier Interactive Patient Education ©2016 [employer] ° °
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
